# Patient Record
Sex: Male | Born: 1995 | Race: Black or African American | Hispanic: No | Marital: Single | Smoking: Former smoker
Health system: Southern US, Community
[De-identification: ages and names within clinical notes are randomized; demographics above are authoritative.]

## PROBLEM LIST (undated history)

## (undated) DIAGNOSIS — J45909 Unspecified asthma, uncomplicated: Secondary | ICD-10-CM

---

## 2014-02-21 ENCOUNTER — Encounter (HOSPITAL_COMMUNITY): Payer: Self-pay | Admitting: Emergency Medicine

## 2014-02-21 ENCOUNTER — Emergency Department (HOSPITAL_COMMUNITY)
Admission: EM | Admit: 2014-02-21 | Discharge: 2014-02-21 | Disposition: A | Discharge status: Home / Self Care | Payer: BC Managed Care – PPO | Attending: Emergency Medicine | Admitting: Emergency Medicine

## 2014-02-21 DIAGNOSIS — Z792 Long term (current) use of antibiotics: Secondary | ICD-10-CM | POA: Insufficient documentation

## 2014-02-21 DIAGNOSIS — R3911 Hesitancy of micturition: Secondary | ICD-10-CM | POA: Diagnosis not present

## 2014-02-21 DIAGNOSIS — J45909 Unspecified asthma, uncomplicated: Secondary | ICD-10-CM | POA: Diagnosis not present

## 2014-02-21 DIAGNOSIS — R339 Retention of urine, unspecified: Secondary | ICD-10-CM | POA: Diagnosis present

## 2014-02-21 HISTORY — DX: Unspecified asthma, uncomplicated: J45.909

## 2014-02-21 LAB — URINALYSIS, ROUTINE W REFLEX MICROSCOPIC
Bilirubin Urine: NEGATIVE
Glucose, UA: NEGATIVE mg/dL
HGB URINE DIPSTICK: NEGATIVE
Ketones, ur: NEGATIVE mg/dL
Leukocytes, UA: NEGATIVE
Nitrite: NEGATIVE
Protein, ur: NEGATIVE mg/dL
SPECIFIC GRAVITY, URINE: 1.023 (ref 1.005–1.030)
UROBILINOGEN UA: 0.2 mg/dL (ref 0.0–1.0)
pH: 7 (ref 5.0–8.0)

## 2014-02-21 MED ORDER — CEFTRIAXONE SODIUM 250 MG IJ SOLR
250.0000 mg | Freq: Once | INTRAMUSCULAR | Status: AC
  Administered 2014-02-21: 250 mg via INTRAMUSCULAR
  Filled 2014-02-21: qty 250

## 2014-02-21 MED ORDER — AZITHROMYCIN 1 G PO PACK
2.0000 g | PACK | Freq: Once | ORAL | Status: AC
  Administered 2014-02-21: 2 g via ORAL
  Filled 2014-02-21: qty 2

## 2014-02-21 MED ORDER — STERILE WATER FOR INJECTION IJ SOLN
INTRAMUSCULAR | Status: AC
  Administered 2014-02-21: 2 mL
  Filled 2014-02-21: qty 10

## 2014-02-21 NOTE — ED Notes (Signed)
Pt. reports urinary hesitation " hard to start.." onset this evening , denies dysuria , no fever or chills. Denies penile discharge.

## 2014-02-21 NOTE — ED Provider Notes (Signed)
CSN: [id number]     Arrival date & time 02/21/14  0221 History   First MD Initiated Contact with Patient 02/21/14 [phone]     Chief Complaint  Patient presents with  . Urinary Retention     (Consider location/radiation/quality/duration/timing/severity/associated sxs/prior Treatment) HPI Todd Barajas is a 18 y.o. male with past medical history of asthma coming in with urinary retention. Patient states this occurred approximately one half hours prior to arrival. He describes trouble urinating. He states that he struggles to begin urinating. He also states that mid stream the flow will stop and then go again. He denies ever having this in the past. He has had recent sex but he states he is having using a condom every time. He denies any dysuria or hematuria. He's had no changes in his bowel movements. He denies any fevers. He has no scrotal swelling or tenderness. Patient has no further complaints.  10 Systems reviewed and are negative for acute change except as noted in the HPI.   Past Medical History  Diagnosis Date  . Asthma    History reviewed. No pertinent past surgical history. No family history on file. History  Substance Use Topics  . Smoking status: Never Smoker   . Smokeless tobacco: Not on file  . Alcohol Use: No    Review of Systems    Allergies  Review of patient's allergies indicates no known allergies.  Home Medications   Prior to Admission medications   Medication Sig Start Date End Date Taking? Authorizing Provider  amoxicillin (AMOXIL) 875 MG tablet Take 875 mg by mouth once.   Yes Historical Provider, MD   BP 140/87  Pulse 68  Temp(Src) 98.2 F (36.8 C) (Oral)  Resp 20  SpO2 100% Physical Exam  Nursing note and vitals reviewed. Constitutional: He is oriented to person, place, and time. Vital signs are normal. He appears well-developed and well-nourished.  Non-toxic appearance. He does not appear ill. No distress.  HENT:  Head: Normocephalic and  atraumatic.  Nose: Nose normal.  Mouth/Throat: Oropharynx is clear and moist. No oropharyngeal exudate.  Eyes: Conjunctivae and EOM are normal. Pupils are equal, round, and reactive to light. No scleral icterus.  Neck: Normal range of motion. Neck supple. No tracheal deviation, no edema, no erythema and normal range of motion present. No mass and no thyromegaly present.  Cardiovascular: Normal rate, regular rhythm, S1 normal, S2 normal, normal heart sounds, intact distal pulses and normal pulses.  Exam reveals no gallop and no friction rub.   No murmur heard. Pulses:      Radial pulses are 2+ on the right side, and 2+ on the left side.       Dorsalis pedis pulses are 2+ on the right side, and 2+ on the left side.  Pulmonary/Chest: Effort normal and breath sounds normal. No respiratory distress. He has no wheezes. He has no rhonchi. He has no rales.  Abdominal: Soft. Normal appearance and bowel sounds are normal. He exhibits no distension, no ascites and no mass. There is no hepatosplenomegaly. There is no tenderness. There is no rebound, no guarding and no CVA tenderness.  Genitourinary: Penis normal. No penile tenderness.  No discharge seen, no penile tenderness or swelling. No scrotal swelling or tenderness. No epididymal tenderness.  Musculoskeletal: Normal range of motion. He exhibits no edema and no tenderness.  Lymphadenopathy:    He has no cervical adenopathy.  Neurological: He is alert and oriented to person, place, and time. He has normal strength. No  cranial nerve deficit or sensory deficit. He exhibits normal muscle tone. GCS eye subscore is 4. GCS verbal subscore is 5. GCS motor subscore is 6.  Skin: Skin is warm, dry and intact. No petechiae and no rash noted. He is not diaphoretic. No erythema. No pallor.  Psychiatric: He has a normal mood and affect. His behavior is normal. Judgment normal.    ED Course  Procedures (including critical care time) Labs Review Labs Reviewed   URINALYSIS, ROUTINE W REFLEX MICROSCOPIC - Abnormal; Notable for the following:    APPearance CLOUDY (*)    All other components within normal limits  GC/CHLAMYDIA PROBE AMP    Imaging Review No results found.   EKG Interpretation None      MDM   Final diagnoses:  None    Patient presents to the emergency department for urinary hesitation.  I am unsure of the cause and he seems to young for enlarged prostate. Urine does not show an infection. Because he is sexually active will treat for sexually transmitted infections with ceftriaxone and azithromycin. and have him follow-up with a primary care physician. His vital signs remained within normal limits and he is safe for discharge.    Tomasita CrumbleAdeleke Morrison Masser, MD 02/21/14 [phone]

## 2014-02-21 NOTE — ED Notes (Signed)
Pt w/ 106ml of urine per bladder scanner, Dr. Mora Bellmanni, EDP made aware.

## 2014-02-21 NOTE — ED Notes (Signed)
Pt ambulating independently w/ steady gait on d/c in no acute distress, A&Ox4. D/c instructions reviewed w/ pt - pt denies any further questions or concerns at present.  

## 2014-02-21 NOTE — Discharge Instructions (Signed)
Acute Urinary Retention Mr. Jean RosenthalJackson, you were seen for difficulty urinating. The urine does not show infection. You were treated for gonorrhea and chlamydia but the test will not come back for another 2 days. Follow-up with a primary care physician regarding her difficulty urinating. If any of your symptoms worsen come back to the emergency department. Thank you. Acute urinary retention is when you are unable to pee (urinate). Acute urinary retention is common in older men. Prostates can get bigger, which blocks the flow of pee.  HOME CARE  Drink enough fluids to keep your pee clear or pale yellow.  If you are sent home with a tube that drains the bladder (catheter), there will be a drainage bag attached to it. There are two types of bags. One is big that you can wear at night without having to empty it. One is smaller and needs to be emptied more often.  Keep the drainage bag empty.  Keep the drainage bag lower than your catheter.  Only take medicine as told by your doctor. GET HELP IF:  You have a low-grade fever.  You have spasms or you are leaking pee when you have spasms. GET HELP RIGHT AWAY IF:   You have chills or a fever.  Your catheter stops draining pee.  Your catheter falls out.  You have increased bleeding that does not stop after you have rested and increased the amount of fluids you had been drinking. MAKE SURE YOU:   Understand these instructions.  Will watch your condition.  Will get help right away if you are not doing well or get worse. Document Released: 09/28/2007 Document Revised: 01/30/2013 Document Reviewed: 09/20/2012 [facility] Patient Information 2015 [place], [place]. This information is not intended to replace advice given to you by your health care provider. Make sure you discuss any questions you have with your health care provider.  Emergency Department Resource Guide 1) Find a Doctor and Pay Out of Pocket Although you won't have to find out who is  covered by your insurance plan, it is a good idea to ask around and get recommendations. You will then need to call the office and see if the doctor you have chosen will accept you as a new patient and what types of options they offer for patients who are self-pay. Some doctors offer discounts or will set up payment plans for their patients who do not have insurance, but you will need to ask so you aren't surprised when you get to your appointment.  2) Contact Your Local Health Department Not all health departments have doctors that can see patients for sick visits, but many do, so it is worth a call to see if yours does. If you don't know where your local health department is, you can check in your phone book. The CDC also has a tool to help you locate your state's health department, and many state websites also have listings of all of their local health departments.  3) Find a Walk-in Clinic If your illness is not likely to be very severe or complicated, you may want to try a walk in clinic. These are popping up all over the country in pharmacies, drugstores, and shopping centers. They're usually staffed by nurse practitioners or physician assistants that have been trained to treat common illnesses and complaints. They're usually fairly quick and inexpensive. However, if you have serious medical issues or chronic medical problems, these are probably not your best option.  No Primary Care Doctor: - Call  Health Connect at  [phone] - they can help you locate a primary care doctor that  accepts your insurance, provides certain services, etc. - Physician Referral Service- [phone]  Chronic Pain Problems: Organization         Address  Phone   Notes  [facility]  [phone] Patients need to be referred by their primary care doctor.   Medication Assistance: Organization         Address  Phone   Notes  [facility] Medication [facility] [address] [place], [place] [address] [phone] --Must be a resident of [facility] -- Must have NO insurance coverage whatsoever (no Medicaid/ Medicare, etc.) -- The pt. MUST have a primary care doctor that directs their care regularly and follows them in the community   MedAssist  [phone]   [employer]  [phone]    Agencies that provide inexpensive medical care: Organization         Address  Phone   Notes  [place]  [phone]   [facility]    [phone]   [facility] [place], [place] [address] [phone]   [place] [address] [place] [phone]   Planned Parenthood    [phone]   [facility]    [phone]   [place] and [address], [place] Phone:  [phone], Fax:  [phone] Hours of Operation:  9 am - 6 pm, M-F.  Also accepts Medicaid/Medicare and self-pay.  [facility] for [place], [address], [place] Phone: [phone], Fax: [phone]. Hours of Operation:  8:30 am - 5:30 pm, M-F.  Also accepts Medicaid and self-pay.  [facility] [place] [address] [place] Phone: [phone]   [place], [place], [place] [phone], Ext. [phone] Mondays & Thursdays: 7-9 AM.  First 15 patients are seen on a first come, first serve basis.    [place]bert [place] Providers:  Organization         Address  Phone   Notes  [facility] [address], [place] [phone] Also accepts self-pay patients.  [facility] [address], [place]  [phone]   [place], [address], [place] [phone]   [facility] [address], [place] [phone]   Lucianne Lei [address], [place]   [phone] Only accepts [place] Access [place] patients after they have their name applied to their card.   Self-Pay (no insurance) in [facility]:  Organization         Address  Phone   Notes  Sickle Cell Patients, [facility] [place] [phone]   [facility] [place] [phone]   [facility] [place]  [place], [place], [place] [phone]   [facility]/Dr. Osei-Bonsu  [address], [place] or [address], [place] [phone] Phone number for both [place] and [place] locations is the same.  [facility] [address], [place] [phone]   [place]  [address] [place] or [address] [phone]   [facility] [place] [phone], phone; [phone], fax Sees patients 1st and 3rd Saturday of every month.  Must not qualify for public or private insurance (i.e. Medicaid, Medicare, [place] Health Choice, Veterans' Benefits)  Household income should be no more than 200% of the poverty level The clinic cannot treat you if you are pregnant or think you are pregnant  Sexually transmitted diseases are not treated at the clinic.    Dental Care: Organization         Address  Phone  Notes  [facility] [place] [phone] Accepts children up to age 73 who are enrolled in [place] or [place]; pregnant women with a Medicaid card; and children who have applied for Medicaid or [place] Health Choice, but were declined, whose parents can pay a reduced fee at time of service.  [facility]  [address], [place] [phone] Accepts children up to age 59 who are enrolled  in [place] or [place]; pregnant women with a Medicaid card; and children who have applied for Medicaid or [place] Health Choice, but were declined, whose parents can pay a reduced fee at time of service.  [place] Adult Dental Access PROGRAM  [place] [phone] Patients are seen by appointment only. Walk-ins are not accepted. [place] will see patients 26 years of age and older. Monday - Tuesday (8am-5pm) Most Wednesdays (8:30-5pm) $30 per visit, cash only  [facility] Adult Dental Access PROGRAM  [address], [facility] [phone] Patients are seen by appointment only. Walk-ins are not accepted. [place] will see patients 29 years of age and older. One Wednesday Evening (Monthly: Volunteer Based).  $30 per visit, cash only  [place]  [phone] for adults; Children under age 66, call Graduate Pediatric Dentistry at [phone]. Children aged 45-14, please call [phone] to request a pediatric application.  Dental services are provided in all areas of dental care including fillings, crowns and bridges, complete and partial dentures, implants, gum treatment, root canals, and extractions. Preventive care is also provided. Treatment is provided to both adults and children. Patients are selected via a lottery and there is often a waiting list.   [facility] [address] [place]  [phone] [email or web address]   [facility] [address] [place], [place] [phone], Ext. [phone] Second and Fourth Thursday of each month, opens at 6:30 AM; Clinic ends at 9 AM.  Patients are seen on a first-come first-served basis, and a limited number are seen during each clinic.   [facility]  [address] Hillard Danker [place], [place] [phone]   Eligibility Requirements You must have lived in [place], [place], or [place] counties for at least the last three months.   You cannot be  eligible for state or federal sponsored [employer], including [employer], [place], or [employer].   You generally cannot be eligible for healthcare insurance through your employer.    How to apply: Eligibility screenings are held every Tuesday and Wednesday afternoon from 1:00 pm until 4:00 pm. You do not need an appointment for the interview!  [facility] [address] [place], [place]   [place]  Box Department  [place]  [phone]    Behavioral Health Resources in the Community: Intensive Outpatient Programs Organization         Address  Phone  Notes  [place]. [address] [place] [address], [place], [place] [phone]   [facility] Outpatient [address], [place], [place]   ADS: Alcohol & Drug Svcs [address], [place], [place]   [place] [address]  [place], [place] or [phone]   Substance Abuse Resources Organization         Address  Phone  Notes  Alcohol and Drug Services  [phone]   [place]  [phone]   [facility] [place]   Chinita Pester  [phone]   Residential & Outpatient Substance Abuse Program  [phone]   Psychological Services Organization         Address  Phone  Notes  [facility] [place]  [place]  [phone]   [place] [address], [place] or [phone]    Mobile Crisis Teams Organization         Address  Phone  Notes  [facility], Mobile Crisis Care Unit  [phone]   [facility]  [address] [place], [place]   [place] [address], [place] [phone]    Self-Help/Support Groups Organization          Address  Phone             Notes  [place]. of [place] - variety of support groups  [place] Call for more information  Narcotics Anonymous (NA), [facility] [address], [employer] [place]  2 meetings at this location   Special educational needs teacher         Address  Phone  Notes  ASAP Residential Treatment [place],    [place]  [phone]   [facility]  [address], [place] [address], [place], [place]   [place], [place] [phone] Admissions: 8am-3pm M-F  Incentives Substance [place] [address],    [place], [place] [phone]   [facility] [address] [place], [place], [place]   [facility] [address],  [place], [place]   [facility] - Intensive Outpatient [address]een [address], [place], [place]   [facility] ([place] [place].,  [place], [place] [phone] or [phone]   [facility] Services (RTS) [address], [place], [place] Accepts Medicaid  [facility] [place] [address],  [place] [phone] Substance Abuse/Addiction Treatment   [facility] Organization         Address  Phone  Notes  [facility]  [phone]   Domenic Schwab, PhD [address] Arlis Porta [place], [place]   [phone] or [phone]   [place], [place] [phone]   [place] [address], [place], [place] [phone] Insurance/Medicaid/sponsorship through [employer] and Families [address]  [place], [place] [phone] Therapy/tele-psych/case  [facility] [address]   [place], [place] [phone]    Dr. Lolly MustacheArfeen  [phone]   [facility]  [employer]  [facility] 1) [address], [place] 2) [address], [place] 3)  [address], [place] [phone](336) [phone]  [phone]   [facility] Child Abuse Hotline [phone](336) [phone] or [phone](336) [phone] (After Hours)

## 2014-02-22 LAB — GC/CHLAMYDIA PROBE AMP
CT PROBE, AMP APTIMA: POSITIVE — AB
GC PROBE AMP APTIMA: NEGATIVE

## 2014-02-23 ENCOUNTER — Telehealth (HOSPITAL_COMMUNITY): Payer: Self-pay

## 2014-02-23 NOTE — Telephone Encounter (Signed)
left message for pt to return call. positive for chlamydia- treated per protocol.

## 2014-02-24 ENCOUNTER — Telehealth (HOSPITAL_COMMUNITY): Payer: Self-pay

## 2014-02-26 ENCOUNTER — Telehealth: Payer: Self-pay | Admitting: Emergency Medicine

## 2014-02-27 ENCOUNTER — Telehealth (HOSPITAL_COMMUNITY): Payer: Self-pay

## 2015-04-07 ENCOUNTER — Emergency Department (HOSPITAL_COMMUNITY)
Admission: EM | Admit: 2015-04-07 | Discharge: 2015-04-08 | Disposition: A | Discharge status: Home / Self Care | Payer: BLUE CROSS/BLUE SHIELD | Attending: Emergency Medicine | Admitting: Emergency Medicine

## 2015-04-07 ENCOUNTER — Encounter (HOSPITAL_COMMUNITY): Payer: Self-pay | Admitting: *Deleted

## 2015-04-07 DIAGNOSIS — J45909 Unspecified asthma, uncomplicated: Secondary | ICD-10-CM | POA: Insufficient documentation

## 2015-04-07 DIAGNOSIS — M545 Low back pain, unspecified: Secondary | ICD-10-CM

## 2015-04-07 DIAGNOSIS — Z792 Long term (current) use of antibiotics: Secondary | ICD-10-CM | POA: Insufficient documentation

## 2015-04-07 NOTE — ED Provider Notes (Signed)
CSN: [id number]     Arrival date & time 04/07/15  2217 History  By signing my name below, I, Octavia Heir, attest that this documentation has been prepared under the direction and in the presence of [employer], PA-C. Electronically Signed: Octavia Heir, ED Scribe. 04/07/2015. 11:35 PM.    Chief Complaint  Patient presents with  . Sore    The history is provided by the patient. No language interpreter was used.   HPI Comments: Todd Barajas is a 19 y.o. male who has hx of asthma presents to the Emergency Department complaining of constant, gradual worsening unknown movement on his right flank onset one week ago.  He rates his current pain a 4/10 and notes the pain in tolerable. Pt states he feels as if he feels like there is something moving on his back. He denies nausea, vomiting, diarrhea, abdominal pain or fever. He denies any bowel or bladder incontinence or retention, history of cancer, IV drug use, or recent prednisone use.  Past Medical History  Diagnosis Date  . Asthma    History reviewed. No pertinent past surgical history. No family history on file. Social History  Substance Use Topics  . Smoking status: Never Smoker   . Smokeless tobacco: None  . Alcohol Use: No    Review of Systems  Constitutional: Negative for fever.  Gastrointestinal: Negative for abdominal pain.  All other systems reviewed and are negative.     Allergies  Review of patient's allergies indicates no known allergies.  Home Medications   Prior to Admission medications   Medication Sig Start Date End Date Taking? Authorizing Provider  amoxicillin (AMOXIL) 875 MG tablet Take 875 mg by mouth once.    Historical Provider, MD   Triage vitals: BP 139/82 mmHg  Pulse 62  Temp(Src) 98.2 F (36.8 C) (Oral)  Resp 14  Ht  (1.778 m)  Wt 220 lb (99.791 kg)  BMI 31.57 kg/m2  SpO2 99% Physical Exam  Constitutional: He is oriented to person, place, and time. He appears well-developed and  well-nourished. No distress.  HENT:  Head: Normocephalic and atraumatic.  Eyes: Conjunctivae and EOM are normal.  Neck: Normal range of motion. Neck supple.  Cardiovascular: Normal rate, regular rhythm and normal heart sounds.   Pulmonary/Chest: Effort normal and breath sounds normal. No respiratory distress. He has no wheezes. He has no rales.  Lungs clear to auscultation bilaterally. Heart: Regular rate and rhythm.  Abdominal:  Abdomen is soft and nontender. No guarding or rebound. No distention.  Musculoskeletal: Normal range of motion. He exhibits no edema.  No CVA tenderness  Neurological: He is alert and oriented to person, place, and time.  Skin: Skin is warm and dry.  No rash on the flank or abdomen.  Psychiatric: He has a normal mood and affect. His behavior is normal.  Nursing note and vitals reviewed.   ED Course  Procedures  DIAGNOSTIC STUDIES: Oxygen Saturation is 99% on RA, normal by my interpretation.  COORDINATION OF CARE:  11:30 PM Discussed treatment plan which includes lab work with pt at bedside and pt agreed to plan.  Labs Review Labs Reviewed  URINALYSIS, ROUTINE W REFLEX MICROSCOPIC (NOT AT [facility]) - Abnormal; Notable for the following:    APPearance CLOUDY (*)    All other components within normal limits  I-STAT CHEM 8, ED    Imaging Review No results found. I have personally reviewed and evaluated these lab results as part of my medical decision-making.   EKG  Interpretation None      MDM  Pt presents to the ED with an unknown movement in his right flank area times one week. He is well appearing and ambulating in the ED. He does not appear to be in pain now. Basic chem 8 and urinalysis was obtained and are unremarkable. I do not believe this is kidney related or a urinary tract infection. This may be a muscle pull although the patient denies any heavy lifting or injury to the area. I explained that he can take ibuprofen as needed for pain. I  discussed follow-up with the patient as well as return precautions and he verbally agrees with the plan. Final diagnoses:  Right-sided low back pain without sciatica   Filed Vitals:   04/07/15 2248 04/08/15 0017  BP: 139/82 137/83  Pulse: 62 72  Temp: 98.2 F (36.8 C)   Resp: 14 14   I personally performed the services described in this documentation, which was scribed in my presence. The recorded information has been reviewed and is accurate.   Catha GosselinHanna Patel-Mills, PA-C 04/10/15 1524  Raeford RazorStephen Kohut, MD 04/14/15 1245

## 2015-04-07 NOTE — ED Notes (Signed)
Pt has focal soreness on right back (Where ribs end).  Pt states that its a "lump", no injury with this.  I can not feel or see anything in the area that he is pointing out as sore.  [facility] and told that he had "unknown bacteria in body" and was placed on antibiotics.  Pt denies increased pain with movement.

## 2015-04-08 LAB — URINALYSIS, ROUTINE W REFLEX MICROSCOPIC
BILIRUBIN URINE: NEGATIVE
GLUCOSE, UA: NEGATIVE mg/dL
HGB URINE DIPSTICK: NEGATIVE
Ketones, ur: NEGATIVE mg/dL
Leukocytes, UA: NEGATIVE
Nitrite: NEGATIVE
PROTEIN: NEGATIVE mg/dL
Specific Gravity, Urine: 1.026 (ref 1.005–1.030)
pH: 5.5 (ref 5.0–8.0)

## 2015-04-08 LAB — I-STAT CHEM 8, ED
BUN: 14 mg/dL (ref 6–20)
CREATININE: 1 mg/dL (ref 0.61–1.24)
Calcium, Ion: 1.22 mmol/L (ref 1.12–1.23)
Chloride: 102 mmol/L (ref 101–111)
Glucose, Bld: 80 mg/dL (ref 65–99)
HCT: 48 % (ref 39.0–52.0)
Hemoglobin: 16.3 g/dL (ref 13.0–17.0)
POTASSIUM: 4.2 mmol/L (ref 3.5–5.1)
Sodium: 141 mmol/L (ref 135–145)
TCO2: 27 mmol/L (ref 0–100)

## 2015-04-08 NOTE — Discharge Instructions (Signed)
Back Pain, Adult °Back pain is very common in adults. The cause of back pain is rarely dangerous and the pain often gets better over time. The cause of your back pain may not be known. Some common causes of back pain include: °· Strain of the muscles or ligaments supporting the spine. °· Wear and tear (degeneration) of the spinal disks. °· Arthritis. °· Direct injury to the back. °For many people, back pain may return. Since back pain is rarely dangerous, most people can learn to manage this condition on their own. °HOME CARE INSTRUCTIONS °Watch your back pain for any changes. The following actions may help to lessen any discomfort you are feeling: °· Remain active. It is stressful on your back to sit or stand in one place for long periods of time. Do not sit, drive, or stand in one place for more than 30 minutes at a time. Take short walks on even surfaces as soon as you are able. Try to increase the length of time you walk each day. °· Exercise regularly as directed by your health care provider. Exercise helps your back heal faster. It also helps avoid future injury by keeping your muscles strong and flexible. °· Do not stay in bed. Resting more than 1-2 days can delay your recovery. °· Pay attention to your body when you bend and lift. The most comfortable positions are those that put less stress on your recovering back. Always use proper lifting techniques, including: °¨ Bending your knees. °¨ Keeping the load close to your body. °¨ Avoiding twisting. °· Find a comfortable position to sleep. Use a firm mattress and lie on your side with your knees slightly bent. If you lie on your back, put a pillow under your knees. °· Avoid feeling anxious or stressed. Stress increases muscle tension and can worsen back pain. It is important to recognize when you are anxious or stressed and learn ways to manage it, such as with exercise. °· Take medicines only as directed by your health care provider. Over-the-counter  medicines to reduce pain and inflammation are often the most helpful. Your health care provider may prescribe muscle relaxant drugs. These medicines help dull your pain so you can more quickly return to your normal activities and healthy exercise. °· Apply ice to the injured area: °¨ Put ice in a plastic bag. °¨ Place a towel between your skin and the bag. °¨ Leave the ice on for 20 minutes, 2-3 times a day for the first 2-3 days. After that, ice and heat may be alternated to reduce pain and spasms. °· Maintain a healthy weight. Excess weight puts extra stress on your back and makes it difficult to maintain good posture. °SEEK MEDICAL CARE IF: °· You have pain that is not relieved with rest or medicine. °· You have increasing pain going down into the legs or buttocks. °· You have pain that does not improve in one week. °· You have night pain. °· You lose weight. °· You have a fever or chills. °SEEK IMMEDIATE MEDICAL CARE IF:  °· You develop new bowel or bladder control problems. °· You have unusual weakness or numbness in your arms or legs. °· You develop nausea or vomiting. °· You develop abdominal pain. °· You feel faint. °  °This information is not intended to replace advice given to you by your health care provider. Make sure you discuss any questions you have with your health care provider. °  °Document Released: 04/11/2005 Document Revised: 05/02/2014 Document Reviewed: 08/13/2013 °Elsevier Interactive Patient Education ©2016 Elsevier   Inc. ° °Emergency Department Resource Guide °1) Find a Doctor and Pay Out of Pocket °Although you won't have to find out who is covered by your insurance plan, it is a good idea to ask around and get recommendations. You will then need to call the office and see if the doctor you have chosen will accept you as a new patient and what types of options they offer for patients who are self-pay. Some doctors offer discounts or will set up payment plans for their patients who do not  have insurance, but you will need to ask so you aren't surprised when you get to your appointment. ° °2) Contact Your Local Health Department °Not all health departments have doctors that can see patients for sick visits, but many do, so it is worth a call to see if yours does. If you don't know where your local health department is, you can check in your phone book. The CDC also has a tool to help you locate your state's health department, and many state websites also have listings of all of their local health departments. ° °3) Find a Walk-in Clinic °If your illness is not likely to be very severe or complicated, you may want to try a walk in clinic. These are popping up all over the country in pharmacies, drugstores, and shopping centers. They're usually staffed by nurse practitioners or physician assistants that have been trained to treat common illnesses and complaints. They're usually fairly quick and inexpensive. However, if you have serious medical issues or chronic medical problems, these are probably not your best option. ° °No Primary Care Doctor: °- Call [facility] at  832-8000 - they can help you locate a primary care doctor that  accepts your insurance, provides certain services, etc. °- Physician Referral Service- 1-800-533-3463 ° °Chronic Pain Problems: °Organization         Address  Phone   Notes  °[facility]  (336) 297-2271 Patients need to be referred by their primary care doctor.  ° °Medication Assistance: °Organization         Address  Phone   Notes  °[place] [facility] [address] °[place], [place] [address] (336) 641-8030 --Must be a resident of [place] °-- Must have NO insurance coverage whatsoever (no Medicaid/ Medicare, etc.) °-- The pt. MUST have a primary care doctor that directs their care regularly and follows them in the community °  °[facility]  (866) 331-1348   °[employer]  (888) 892-1162   ° °Agencies that  provide inexpensive medical care: °Organization         Address  Phone   Notes  °[facility]  (336) 832-8035   ° Internal Medicine    (336) 832-7272   °[facility] [address] °[place], [place] [address] (336) 832-4777   °[facility] [address], °[place] (336) 271-4999   °[facility]    (336) 373-0678   °[facility]    (336) 272-1050   °[facility] ° [address], [place] Phone:  (336) 832-4444, Fax:  (336) 832-4440 Hours of Operation:  9 am - 6 pm, M-F.  Also accepts Medicaid/Medicare and self-pay.  ° [facility] ° [address], [place] Phone: (336) 832-3150, Fax: (336) 832-3151. Hours of Operation:  8:30 am - 5:30 pm, M-F.  Also accepts Medicaid and self-pay.  °[facility]   [place] [address], [place] Phone: (336) 878-6027   °[facility] [address], [place], [place] (336)723-1848, Ext. 123 Mondays & Thursdays: 7-9 AM.  First 15 patients are seen on a first come, first serve basis. °  ° °Medicaid-accepting [place] Providers: ° °Organization         Address  Phone   Notes  °[facility] [address], [place] (336) 641-2100 Also accepts self-pay patients.  °[facility] [address], [place] ° (336) 856-9996   °[facility] [address], [place] (336) 288-8857   °[facility] [address] [place] [address], [place] (336) 299-7000   °Veita Bland [address], [place]  ° (336) 373-1557 Only accepts [place] Access Medicaid patients after they have their name applied to their card.  ° °Self-Pay (no insurance) in [place]: ° °Organization         Address  Phone   Notes  °Sickle Cell Patients, [facility] [address], [place] (336) 832-1970   °[facility] [address], [place] (336) 832-4400   ° [facility] ° [address], [place] (336) 992-4800   °[facility]/Dr. Osei-Bonsu ° [address] [place] [address], [place] or [address], [place] (336) 841-8500 Phone number for both [place] and [place] locations is the same.  °[facility] [address], [place] (336) 299-0000   °[facility] [address] [place] [address], [place] or [address] (336) 852-7530 °(336) 878-2260   °[facility] [address], [place] (336) 350-1642, phone; (336) 294-5005, fax Sees patients 1st and 3rd Saturday of every month.  Must not qualify for public or private insurance (i.e. Medicaid, Medicare, [place] Health Choice, Veterans' Benefits) • Household income should be no more than 200% of the poverty level •The clinic cannot treat you if you are pregnant or think you are pregnant • Sexually transmitted diseases are not treated at the clinic.  ° ° °Dental Care: °Organization         Address  Phone  Notes  °[facility] [facility] [address], [place] (336) 641-6152 Accepts children up to age 21 who are enrolled in Medicaid or [place] Health Choice; pregnant women with a Medicaid card; and children who have applied for Medicaid or [place] Health Choice, but were declined, whose parents can pay a reduced fee at time of service.  °[facility] [place]  [address], [place] (336) 641-7733 Accepts children up to age 21 who are enrolled in Medicaid or [place] Health Choice; pregnant women with a Medicaid card; and children who have applied for Medicaid or [place] Health Choice, but were declined, whose parents can pay a reduced fee at time of service.  °[facility] ° [address], [place] (336) 641-4533 Patients are seen by appointment only. Walk-ins  are not accepted. [facility] will see patients 18 years of age and older. °Monday - Tuesday (8am-5pm) °Most Wednesdays (8:30-5pm) °$30 per visit, cash only  °[facility] ° [address], [place] (336) 641-4533 Patients are seen by appointment only. Walk-ins are not accepted. [facility] will see patients 18 years of age   and older. °One Wednesday Evening (Monthly: Volunteer Based).  $30 per visit, cash only  °[facility]  (919) 537-3737 for adults; Children under age 4, call Graduate Pediatric Dentistry at (919) [phone]. Children aged 4-14, please call (919) [phone] to request a pediatric application. ° Dental services are provided in all areas of dental care including fillings, crowns and bridges, complete and partial dentures, implants, gum treatment, root canals, and extractions. Preventive care is also provided. Treatment is provided to both adults and children. °Patients are selected via a lottery and there is often a waiting list. °  °[facility] [address], °[place] ° (336) 763-8833 [email or web address] °  °[facility] [address], [place], [place] (336)723-1848, Ext. 123 Second and Fourth Thursday of each month, opens at 6:30 AM; Clinic ends at 9 AM.  Patients are seen on a first-come first-served basis, and a limited number are seen during each clinic.  ° °[facility] ° [address], [place], [place] (336) 723-7904   Eligibility Requirements °You must have lived in [place], [place], or [place] counties for at least the last three months. °  You cannot be eligible for state or federal sponsored healthcare insurance, including Veterans Administration, Medicaid, or Medicare. °  You generally cannot be eligible for healthcare insurance through your employer.  °  How to apply: °Eligibility screenings are held every Tuesday and Wednesday afternoon from 1:00 pm until 4:00 pm. You do not need an appointment  for the interview!  °[facility] [address], [place], [place] 336-631-2330   °[facility]  336-342-8273   °[facility]  336-703-3100   °[facility]  336-570-6415   ° °Behavioral Health Resources in the Community: °Intensive Outpatient Programs °Organization         Address  Phone  Notes  °[facility] [address], [place], [place] 336-878-6098   °[facility] [address], [place], [place] 336-832-9800   °[facility] [address], [place], [place] ° 336-882-2125   °[facility] [address],  °[place], [place] 1-800-853-5163 or 336-641-4981   °Substance Abuse Resources °Organization         Address  Phone  Notes  °[facility]  336-882-2125   °[facility]  336-784-9470   °[facility]  336-285-9073   °[facility]  336-845-3988   °Residential & Outpatient Substance Abuse Program  1-800-659-3381   °Psychological Services °Organization         Address  Phone  Notes  °[facility]  336- 832-9600   °[facility]  336- 378-7881   °[facility] [address], [place] 1-800-853-5163 or 336-641-4981   ° °Mobile Crisis Teams °Organization         Address  Phone  Notes  °[facility], Mobile Crisis Care Unit  1-877-626-1772   °Assertive °[facility] ° [address] [place], [place] 336-834-9664   °Sharon DeEsch [address] °[place] 336-554-5454   ° °Self-Help/Support Groups °Organization         Address  Phone             Notes  °[facility]  - variety of support groups  336- 373-1402 Call for more information  °Narcotics Anonymous (NA), [facility] [address], °[place]   2 meetings at   this location  ° °Residential Treatment Programs °Organization         Address  Phone  Notes  °[facility] [address],    °[place]  1-866-801-8205   °[facility] ° [address], [place], [place] 704-293-8524   °[facility] [address], [place] 336-845-3988 Admissions: 8am-3pm M-F  °[facility] [address]    °[place], [place] 336-841-1104   °[facility] [address], [place], [place] 336-379-7146   °[facility] [address]  °[place], [place] 336-285-9073   °[facility] - Intensive Outpatient [address], [place], [place] 336-852-3033   °[facility] [address]  °[place], [place] 1-877-615-2722 or 336-784-9470   °[facility] [address] [place], [place] 336-227-7417 Accepts Medicaid  °[facility] [address]  ° [place] 1-800-659-3381 Substance Abuse/Addiction Treatment  ° °[place] Behavioral Health Resources °Organization         Address  Phone  Notes  °[facility]  (888) 581-9988   °Julie Brannon, PhD [address] [place], [place]   (336) 349-5553 or (336) 951-0000   °[facility]   [address] °[place], [place] (336) 349-4454   °[facility] [address], [place], [place] (336) 342-8316 Insurance/Medicaid/sponsorship through [facility]  °[facility] [address]                                    [place], [place] (336) 342-8316 Therapy/tele-psych/case  °[facility] [address]  ° [place], [place] (336) 349-2233    °Dr. Arfeen  (336) 349-4544   °[facility] [place]  [employer] [place] [facility] 1) [address], [place] °2) [address], [place] °3)  [address], [place] (336) 349-3220 °(336) 342-7768 ° °(336) 342-8140   °[place] [facility] (336) 342-1394 or (336) 342-3537 (After Hours)    ° ° ° °

## 2015-05-19 ENCOUNTER — Encounter (HOSPITAL_COMMUNITY): Payer: Self-pay

## 2015-05-19 ENCOUNTER — Emergency Department (HOSPITAL_COMMUNITY): Payer: BLUE CROSS/BLUE SHIELD

## 2015-05-19 ENCOUNTER — Emergency Department (HOSPITAL_COMMUNITY)
Admission: EM | Admit: 2015-05-19 | Discharge: 2015-05-19 | Disposition: A | Discharge status: Home / Self Care | Payer: BLUE CROSS/BLUE SHIELD | Attending: Emergency Medicine | Admitting: Emergency Medicine

## 2015-05-19 DIAGNOSIS — M546 Pain in thoracic spine: Secondary | ICD-10-CM | POA: Insufficient documentation

## 2015-05-19 DIAGNOSIS — J45909 Unspecified asthma, uncomplicated: Secondary | ICD-10-CM | POA: Insufficient documentation

## 2015-05-19 LAB — URINALYSIS, ROUTINE W REFLEX MICROSCOPIC
Bilirubin Urine: NEGATIVE
Glucose, UA: NEGATIVE mg/dL
Hgb urine dipstick: NEGATIVE
Ketones, ur: NEGATIVE mg/dL
LEUKOCYTES UA: NEGATIVE
NITRITE: NEGATIVE
PH: 6 (ref 5.0–8.0)
Protein, ur: NEGATIVE mg/dL
SPECIFIC GRAVITY, URINE: 1.034 — AB (ref 1.005–1.030)

## 2015-05-19 LAB — I-STAT CHEM 8, ED
BUN: 17 mg/dL (ref 6–20)
CHLORIDE: 104 mmol/L (ref 101–111)
Calcium, Ion: 1.2 mmol/L (ref 1.12–1.23)
Creatinine, Ser: 0.8 mg/dL (ref 0.61–1.24)
GLUCOSE: 89 mg/dL (ref 65–99)
HEMATOCRIT: 48 % (ref 39.0–52.0)
Hemoglobin: 16.3 g/dL (ref 13.0–17.0)
POTASSIUM: 3.8 mmol/L (ref 3.5–5.1)
Sodium: 141 mmol/L (ref 135–145)
TCO2: 22 mmol/L (ref 0–100)

## 2015-05-19 MED ORDER — IBUPROFEN 400 MG PO TABS
800.0000 mg | ORAL_TABLET | Freq: Once | ORAL | Status: AC
  Administered 2015-05-19: 800 mg via ORAL
  Filled 2015-05-19: qty 2

## 2015-05-19 MED ORDER — IBUPROFEN 800 MG PO TABS: 800.0000 mg | ORAL_TABLET | Freq: Three times a day (TID) | ORAL | Status: DC

## 2015-05-19 NOTE — ED Notes (Signed)
Pt reports seen in ED one month ago for right mid lateral back pain.  Pain is worsening, intermittant x 4-5 times day.  No other s/s noted.  NAD at triage.

## 2015-05-19 NOTE — ED Provider Notes (Signed)
CSN: [id number]     Arrival date & time 05/19/15  1508 History   By signing my name below, I, Todd Barajas, attest that this documentation has been prepared under the direction and in the presence of Cheri Fowler, PA-C Electronically Signed: Jarvis Barajas, ED Scribe. 05/19/2015. 6:44 PM.    Chief Complaint  Patient presents with  . Back Pain   The history is provided by the patient. No language interpreter was used.   HPI Comments: Todd Barajas is a 20 y.o. male who presents to the Emergency Department complaining of intermittent, sharp, right, mid lateral back pain for 1 month. Pt endorses that it feels like there is a "lump" to the area of pain. Pt states he has "random" episodes of sharp pain 4-5x per day. He reports the pain is not related to movement. Pt has not taken any medications for the pain. Pt notes he was seen in ED 1 month ago for the same; per chart review pt had a chem 8 and UA done at the time which were negative.  He denies any injury or heavy lifting. He denies any fevers, chills, n/v/d, abdominal pain, dysuria, hematuria, bowel/bladder incontinence, saddle anesthesia, or other associated symptoms at this time.  No IVDU or hx of cancer.  Past Medical History  Diagnosis Date  . Asthma    History reviewed. No pertinent past surgical history. History reviewed. No pertinent family history. Social History  Substance Use Topics  . Smoking status: Never Smoker   . Smokeless tobacco: None  . Alcohol Use: No    Review of Systems A complete 10 system review of systems was obtained and all systems are negative except as noted in the HPI and PMH.     Allergies  Review of patient's allergies indicates no known allergies.  Home Medications   Prior to Admission medications   Medication Sig Start Date End Date Taking? Authorizing Provider  amoxicillin (AMOXIL) 875 MG tablet Take 875 mg by mouth once.    Historical Provider, MD   BP 134/82 mmHg  Pulse 65  Temp(Src)  97.6 F (36.4 C) (Oral)  Resp 18  Ht  (1.803 m)  Wt 218 lb 14.4 oz (99.292 kg)  BMI 30.54 kg/m2  SpO2 98% Physical Exam  Constitutional: He is oriented to person, place, and time. He appears well-developed and well-nourished.  Non-toxic appearance. He does not have a sickly appearance. He does not appear ill.  HENT:  Head: Normocephalic and atraumatic.  Mouth/Throat: Oropharynx is clear and moist.  Eyes: Conjunctivae are normal. Pupils are equal, round, and reactive to light.  Neck: Normal range of motion. Neck supple.  Cardiovascular: Normal rate, regular rhythm and normal heart sounds.   No murmur heard. Pulmonary/Chest: Effort normal and breath sounds normal. No accessory muscle usage or stridor. No respiratory distress. He has no wheezes. He has no rhonchi. He has no rales.    Abdominal: Soft. Bowel sounds are normal. He exhibits no distension. There is no tenderness. There is no rigidity, no rebound, no guarding and no CVA tenderness.  Musculoskeletal: Normal range of motion. He exhibits no tenderness.  Lymphadenopathy:    He has no cervical adenopathy.  Neurological: He is alert and oriented to person, place, and time.  Speech clear without dysarthria.  Skin: Skin is warm and dry. No rash noted. No erythema.  Psychiatric: He has a normal mood and affect. His behavior is normal.    ED Course  Procedures (including critical care time)  DIAGNOSTIC STUDIES: Oxygen Saturation is 98% on RA, normal by my interpretation.    COORDINATION OF CARE: 5:35 PM- Will order CXR, chem 8, UA, and Ibuprofen . Pt advised of plan for treatment and pt agrees.   Labs Review Labs Reviewed  URINALYSIS, ROUTINE W REFLEX MICROSCOPIC (NOT AT [facility]) - Abnormal; Notable for the following:    Specific Gravity, Urine 1.034 (*)    All other components within normal limits  I-STAT CHEM 8, ED    Imaging Review Dg Chest 2 View  05/19/2015  CLINICAL DATA:  Posterior RIGHT lower rib pain at  night and tenth ribs and RIGHT flank for 1 month, no known trauma, wears a belt to march with a drum questioned cause EXAM: CHEST  2 VIEW COMPARISON:  None FINDINGS: Normal heart size, mediastinal contours, and pulmonary vascularity. Lungs clear. No pleural effusion or pneumothorax. Bones unremarkable. Specifically, visualized lower RIGHT ribs are normal in appearance. IMPRESSION: Normal exam. Electronically Signed   By: Ulyses Southward M.D.   On: 05/19/2015 17:53   I have personally reviewed and evaluated these images and lab results as part of my medical decision-making.   EKG Interpretation None      MDM   Final diagnoses:  Right-sided thoracic back pain    Patient presents with intermittent right mid lateral back pain.  VSS, NAD.  No meds PTA.  Patient seen 1 month ago in ED for the same instructed to take ibuprofen and possible msk etiology.  No systemic symptoms or urinary symptoms.  No injury/trauma.  On exam, right thoracic back non-TTP.  No masses, deformities, or crepitus.  I am able to palpate patient's rib and he states that is the "lump".  CXR unremarkable.  i-stat chem 8 and UA obtained to r/o kidney or infectious etiology.  I suspect musculoskeletal etiology and plan to d/c home with ibuprofen.  Discussed return precautions.  Patient agrees and acknowledges the above plan for discharge.  I personally performed the services described in this documentation, which was scribed in my presence. The recorded information has been reviewed and is accurate.     Cheri Fowler, PA-C 05/19/15 1846  Vanetta Mulders, MD 05/23/15 [phone]

## 2015-05-19 NOTE — Discharge Instructions (Signed)
Back Pain, Adult °Back pain is very common in adults. The cause of back pain is rarely dangerous and the pain often gets better over time. The cause of your back pain may not be known. Some common causes of back pain include: °· Strain of the muscles or ligaments supporting the spine. °· Wear and tear (degeneration) of the spinal disks. °· Arthritis. °· Direct injury to the back. °For many people, back pain may return. Since back pain is rarely dangerous, most people can learn to manage this condition on their own. °HOME CARE INSTRUCTIONS °Watch your back pain for any changes. The following actions may help to lessen any discomfort you are feeling: °· Remain active. It is stressful on your back to sit or stand in one place for long periods of time. Do not sit, drive, or stand in one place for more than 30 minutes at a time. Take short walks on even surfaces as soon as you are able. Try to increase the length of time you walk each day. °· Exercise regularly as directed by your health care provider. Exercise helps your back heal faster. It also helps avoid future injury by keeping your muscles strong and flexible. °· Do not stay in bed. Resting more than 1-2 days can delay your recovery. °· Pay attention to your body when you bend and lift. The most comfortable positions are those that put less stress on your recovering back. Always use proper lifting techniques, including: °¨ Bending your knees. °¨ Keeping the load close to your body. °¨ Avoiding twisting. °· Find a comfortable position to sleep. Use a firm mattress and lie on your side with your knees slightly bent. If you lie on your back, put a pillow under your knees. °· Avoid feeling anxious or stressed. Stress increases muscle tension and can worsen back pain. It is important to recognize when you are anxious or stressed and learn ways to manage it, such as with exercise. °· Take medicines only as directed by your health care provider. Over-the-counter  medicines to reduce pain and inflammation are often the most helpful. Your health care provider may prescribe muscle relaxant drugs. These medicines help dull your pain so you can more quickly return to your normal activities and healthy exercise. °· Apply ice to the injured area: °¨ Put ice in a plastic bag. °¨ Place a towel between your skin and the bag. °¨ Leave the ice on for 20 minutes, 2-3 times a day for the first 2-3 days. After that, ice and heat may be alternated to reduce pain and spasms. °· Maintain a healthy weight. Excess weight puts extra stress on your back and makes it difficult to maintain good posture. °SEEK MEDICAL CARE IF: °· You have pain that is not relieved with rest or medicine. °· You have increasing pain going down into the legs or buttocks. °· You have pain that does not improve in one week. °· You have night pain. °· You lose weight. °· You have a fever or chills. °SEEK IMMEDIATE MEDICAL CARE IF:  °· You develop new bowel or bladder control problems. °· You have unusual weakness or numbness in your arms or legs. °· You develop nausea or vomiting. °· You develop abdominal pain. °· You feel faint. °  °This information is not intended to replace advice given to you by your health care provider. Make sure you discuss any questions you have with your health care provider. °  °Document Released: 04/11/2005 Document Revised: 05/02/2014 Document Reviewed: 08/13/2013 °Elsevier Interactive Patient Education ©2016 Elsevier   Inc. ° °Emergency Department Resource Guide °1) Find a Doctor and Pay Out of Pocket °Although you won't have to find out who is covered by your insurance plan, it is a good idea to ask around and get recommendations. You will then need to call the office and see if the doctor you have chosen will accept you as a new patient and what types of options they offer for patients who are self-pay. Some doctors offer discounts or will set up payment plans for their patients who do not  have insurance, but you will need to ask so you aren't surprised when you get to your appointment. ° °2) Contact Your Local Health Department °Not all health departments have doctors that can see patients for sick visits, but many do, so it is worth a call to see if yours does. If you don't know where your local health department is, you can check in your phone book. The CDC also has a tool to help you locate your state's health department, and many state websites also have listings of all of their local health departments. ° °3) Find a Walk-in Clinic °If your illness is not likely to be very severe or complicated, you may want to try a walk in clinic. These are popping up all over the country in pharmacies, drugstores, and shopping centers. They're usually staffed by nurse practitioners or physician assistants that have been trained to treat common illnesses and complaints. They're usually fairly quick and inexpensive. However, if you have serious medical issues or chronic medical problems, these are probably not your best option. ° °No Primary Care Doctor: °- Call [facility] at  832-8000 - they can help you locate a primary care doctor that  accepts your insurance, provides certain services, etc. °- Physician Referral Service- 1-800-533-3463 ° °Chronic Pain Problems: °Organization         Address  Phone   Notes  °[facility]  (336) 297-2271 Patients need to be referred by their primary care doctor.  ° °Medication Assistance: °Organization         Address  Phone   Notes  °[place] [facility] [address] °[place], [place] [address] (336) 641-8030 --Must be a resident of [place] °-- Must have NO insurance coverage whatsoever (no Medicaid/ Medicare, etc.) °-- The pt. MUST have a primary care doctor that directs their care regularly and follows them in the community °  °[facility]  (866) 331-1348   °[employer]  (888) 892-1162   ° °Agencies that  provide inexpensive medical care: °Organization         Address  Phone   Notes  °[facility]  (336) 832-8035   °[facility]    (336) 832-7272   °[facility] [address] °[place], [place] [address] (336) 832-4777   °[facility] [address], °[place] (336) 271-4999   °[facility]    (336) 373-0678   °[facility]    (336) 272-1050   °[facility] ° [address], [place] Phone:  (336) 832-4444, Fax:  (336) 832-4440 Hours of Operation:  9 am - 6 pm, M-F.  Also accepts Medicaid/Medicare and self-pay.  °[facility] ° [address], [place] Phone: (336) 832-3150, Fax: (336) 832-3151. Hours of Operation:  8:30 am - 5:30 pm, M-F.  Also accepts Medicaid and self-pay.  °[facility]   [place] [address], [place] Phone: (336) 878-6027   °[facility] [address], [place], [place] (336)723-1848, Ext. 123 Mondays & Thursdays: 7-9 AM.  First 15 patients are seen on a first come, first serve basis. °  ° °Medicaid-accepting [place] Providers: ° °Organization         Address  Phone   Notes  °[facility] [address], [place] (336) 641-2100 Also accepts self-pay patients.  °[facility] [address], [place] ° (336) 856-9996   °[facility] [address], [place] (336) 288-8857   °[facility] [address] [place] [address], [place] (336) 299-7000   °Veita Bland [address], [place]  ° (336) 373-1557 Only accepts [place] Access Medicaid patients after they have their name applied to their card.  ° °Self-Pay (no insurance) in [place]: ° °Organization         Address  Phone   Notes  °Sickle Cell Patients, [facility] [address], [place] (336) 832-1970   °[facility] [address], [place] (336) 832-4400   °[facility] ° [address], [place] (336) 992-4800   °[facility]/Dr. Osei-Bonsu ° [address] [place] [address], [place] or [address], [place] (336) 841-8500 Phone number for both [place] and [place] locations is the same.  °[facility] [address], [place] (336) 299-0000   °[facility] [address] [place] [address], [place] or [address] (336) 852-7530 °(336) 878-2260   °[facility] [address], [place] (336) 350-1642, phone; (336) 294-5005, fax Sees patients 1st and 3rd Saturday of every month.  Must not qualify for public or private insurance (i.e. Medicaid, Medicare, [place] Health Choice, Veterans' Benefits) • Household income should be no more than 200% of the poverty level •The clinic cannot treat you if you are pregnant or think you are pregnant • Sexually transmitted diseases are not treated at the clinic.  ° ° °Dental Care: °Organization         Address  Phone  Notes  °[facility] [address], [place] (336) 641-6152 Accepts children up to age 21 who are enrolled in Medicaid or [place] Health Choice; pregnant women with a Medicaid card; and children who have applied for Medicaid or [place] Health Choice, but were declined, whose parents can pay a reduced fee at time of service.  °[facility] [place]  [address], [place] (336) 641-7733 Accepts children up to age 21 who are enrolled in Medicaid or [place] Health Choice; pregnant women with a Medicaid card; and children who have applied for Medicaid or [place] Health Choice, but were declined, whose parents can pay a reduced fee at time of service.  °[facility] ° [address], [place] (336) 641-4533 Patients are seen by appointment only. Walk-ins  are not accepted. [facility] will see patients 18 years of age and older. °Monday - Tuesday (8am-5pm) °Most Wednesdays (8:30-5pm) °$30 per visit, cash only  °[facility] ° [address], [place] (336) 641-4533 Patients are seen by appointment only. Walk-ins are not accepted. [facility] will see patients 18 years of age   and older. °One Wednesday Evening (Monthly: Volunteer Based).  $30 per visit, cash only  °[facility]  (919) 537-3737 for adults; Children under age 4, call Graduate Pediatric Dentistry at (919) [phone]. Children aged 4-14, please call (919) 537-3737 to request a pediatric application. ° Dental services are provided in all areas of dental care including fillings, crowns and bridges, complete and partial dentures, implants, gum treatment, root canals, and extractions. Preventive care is also provided. Treatment is provided to both adults and children. °Patients are selected via a lottery and there is often a waiting list. °  °[facility] [address], °[place] ° (336) 763-8833 [email or web address] °  °[facility] [address], [place], [place] (336)723-1848, Ext. 123 Second and Fourth Thursday of each month, opens at 6:30 AM; Clinic ends at 9 AM.  Patients are seen on a first-come first-served basis, and a limited number are seen during each clinic.  ° °[facility] ° [address], [place], [place] (336) 723-7904   Eligibility Requirements °You must have lived in [place], [place], or [place] counties for at least the last three months. °  You cannot be eligible for state or federal sponsored healthcare insurance, including Veterans Administration, Medicaid, or Medicare. °  You generally cannot be eligible for healthcare insurance through your employer.  °  How to apply: °Eligibility screenings are held every Tuesday and Wednesday afternoon from 1:00 pm until 4:00 pm. You do not need an appointment  for the interview!  °[facility] [address], [place], [place] 336-631-2330   °[facility]  336-342-8273   °[facility]  336-703-3100   °[facility]  336-570-6415   ° °Behavioral Health Resources in the Community: °Intensive Outpatient Programs °Organization         Address  Phone  Notes  °[facility] [address], [place], [place] 336-878-6098   °[facility] [address], [place], [place] 336-832-9800   °[facility] [address], [place], [place] ° 336-882-2125   °[facility] [address],  °[place], [place] 1-800-853-5163 or 336-641-4981   °Substance Abuse Resources °Organization         Address  Phone  Notes  °[facility]  336-882-2125   °[facility]  336-784-9470   °[facility]  336-285-9073   °[facility]  336-845-3988   °Residential & Outpatient Substance Abuse Program  1-800-659-3381   °Psychological Services °Organization         Address  Phone  Notes  °[facility]  336- 832-9600   °[facility]  336- 378-7881   °[facility] [address], [place] 1-800-853-5163 or 336-641-4981   ° °Mobile Crisis Teams °Organization         Address  Phone  Notes  °[facility], Mobile Crisis Care Unit  1-877-626-1772   °[facility]ive °[facility] ° [address] [place], [place] 336-834-9664   °Sharon DeEsch [address] °[place] 336-554-5454   ° °Self-Help/Support Groups °Organization         Address  Phone             Notes  °[facility] - variety of support groups  336- 373-1402 Call for more information  °Narcotics Anonymous (NA), [facility] [address], °[place]  2 meetings at   this location  ° °Residential Treatment Programs °Organization         Address  Phone  Notes  °[facility] [address],    °[place]  1-866-801-8205   °[facility] ° [address], [place], [place] 704-293-8524   °[facility] [address], [place] 336-845-3988 Admissions: 8am-3pm M-F  °[facility] [address]    °[place], [place] 336-841-1104   °[facility] [address], [place], [place] 336-379-7146   °[facility] [address]  °[place], [place] 336-285-9073   °[facility] Intensive Outpatient [address], [place], [place] 336-852-3033   °[facility] [address]  °[place], [place] 1-877-615-2722 or 336-784-9470   °[facility] ([facility] [address] [place], [place] 336-227-7417 Accepts Medicaid  °[facility] [address]  °[place] 1-800-659-3381 Substance Abuse/Addiction Treatment  ° °[place] Behavioral Health Resources °Organization         Address  Phone  Notes  °[facility]  (888) 581-9988   °Julie Brannon, PhD [address] [place], [place]   (336) 349-5553 or (336) 951-0000   °[facility]   [address] °[place], [place] (336) 349-4454   °[facility] [address], [place], [place] (336) 342-8316 Insurance/Medicaid/sponsorship through [facility]  °[facility] [address]                                    [place], [place] (336) 342-8316 Therapy/tele-psych/case  °[facility] [address]  ° [place], [place] (336) 349-2233    °Dr. Arfeen  (336) 349-4544   °[facility] [place]  [employer] [facility] [place] [facility] 1) [address], [place] °2) [address], [place] °3)  [address], [place] (336) 349-3220 °(336) 342-7768 ° °(336) 342-8140   °[place] [facility] (336) 342-1394 or (336) 342-3537 (After Hours)    ° ° ° °

## 2015-05-19 NOTE — ED Notes (Signed)
Pt ambulates independently and with steady gait at time of discharge. Discharge instructions and follow up information reviewed with patient. No other questions or concerns voiced at this time. RX x 1 given. 

## 2016-01-30 ENCOUNTER — Emergency Department (HOSPITAL_COMMUNITY)
Admission: EM | Admit: 2016-01-30 | Discharge: 2016-01-30 | Disposition: A | Discharge status: Home / Self Care | Payer: BLUE CROSS/BLUE SHIELD | Attending: Emergency Medicine | Admitting: Emergency Medicine

## 2016-01-30 ENCOUNTER — Encounter (HOSPITAL_COMMUNITY): Payer: Self-pay | Admitting: *Deleted

## 2016-01-30 DIAGNOSIS — Y999 Unspecified external cause status: Secondary | ICD-10-CM | POA: Insufficient documentation

## 2016-01-30 DIAGNOSIS — S0181XA Laceration without foreign body of other part of head, initial encounter: Secondary | ICD-10-CM

## 2016-01-30 DIAGNOSIS — J45909 Unspecified asthma, uncomplicated: Secondary | ICD-10-CM | POA: Diagnosis not present

## 2016-01-30 DIAGNOSIS — Y929 Unspecified place or not applicable: Secondary | ICD-10-CM | POA: Insufficient documentation

## 2016-01-30 DIAGNOSIS — Y939 Activity, unspecified: Secondary | ICD-10-CM | POA: Diagnosis not present

## 2016-01-30 DIAGNOSIS — W228XXA Striking against or struck by other objects, initial encounter: Secondary | ICD-10-CM | POA: Diagnosis not present

## 2016-01-30 DIAGNOSIS — S01112A Laceration without foreign body of left eyelid and periocular area, initial encounter: Secondary | ICD-10-CM | POA: Diagnosis not present

## 2016-01-30 MED ORDER — LIDOCAINE HCL (PF) 1 % IJ SOLN
5.0000 mL | Freq: Once | INTRAMUSCULAR | Status: AC
  Administered 2016-01-30: 5 mL
  Filled 2016-01-30: qty 5

## 2016-01-30 NOTE — Discharge Instructions (Signed)
Please read and follow all provided instructions.  Your diagnoses today include:  1. Facial laceration, initial encounter    Tests performed today include: Vital signs. See below for your results today.   Medications prescribed:   Take any prescribed medications only as directed.   Home care instructions:  Follow any educational materials and wound care instructions contained in this packet.   You may shower and wash the area with soap and water, just be sure to pat the area dry and not rub over the stitches. Do no put your stiches underwater (in a bath, pool, or lake). Getting stiches wet can slow down healing and increase your chances of getting an infection. You may apply Bacitracin or Neosporin twice a day for 7 days, and keep the ara clean with  bandage or gauze. Do not apply alcohol or hydrogen peroxide. Cover the area if it draining or weeping.   Follow-up instructions: Suture Removal: Return to the Emergency Department or see your primary care care doctor in 7 days for a recheck of your wound and removal of your sutures or staples.    Return instructions:  Return to the Emergency Department if you have: Fever Worsening pain Worsening swelling of the wound Pus draining from the wound Redness of the skin that moves away from the wound, especially if it streaks away from the affected area  Any other emergent concerns  Your vital signs today were: BP 150/88 (BP Location: Right Arm)    Pulse 65    Temp 97.6 F (36.4 C) (Oral)    Resp 18    SpO2 99%  If your blood pressure (BP) was elevated above 135/85 this visit, please have this repeated by your doctor within one month. --------------

## 2016-01-30 NOTE — ED Provider Notes (Signed)
[facility]EM[facility]ENC[facility] DEPT Provider Note   CSN: [id number] Arrival date & time: 01/30/16  1323   By signing my name below, I, Valentino SaxonBianca Contreras, attest that this documentation has been prepared under the direction and in the presence of  Audry Piliyler Christiann Hagerty PA-C Electronically Signed: Valentino SaxonBianca Contreras, ED Scribe. 01/30/16. 1:53 PM.  History   Chief Complaint Chief Complaint  Patient presents with  . Laceration   The history is provided by the patient. No language interpreter was used.    HPI Comments: Todd Barajas is a 20 y.o. male who presents to the Emergency Department complaining of laceration to left eyebrow occurred an hour ago. Pt associates soreness to affected area, which he rates as 2/10 currently. Tetanus vaccine UTD. Pt denies taking anti-coagulants.  No visual disturbances. No other symptoms noted.   Past Medical History:  Diagnosis Date  . Asthma     There are no active problems to display for this patient.  History reviewed. No pertinent surgical history.   Home Medications    Prior to Admission medications   Medication Sig Start Date End Date Taking? Authorizing Provider  amoxicillin (AMOXIL) 875 MG tablet Take 875 mg by mouth once.    Historical Provider, MD  ibuprofen (ADVIL,MOTRIN) 800 MG tablet Take 1 tablet (800 mg total) by mouth 3 (three) times daily. 05/19/15   Cheri FowlerKayla Rose, PA-C    Family History History reviewed. No pertinent family history.  Social History Social History  Substance Use Topics  . Smoking status: Never Smoker  . Smokeless tobacco: Not on file  . Alcohol use No   Allergies   Review of patient's allergies indicates no known allergies.   Review of Systems Review of Systems  Eyes: Negative for pain.  Skin: Positive for wound (left eyebrow).   Physical Exam Updated Vital Signs BP 150/88 (BP Location: Right Arm)   Pulse 65   Temp 97.6 F (36.4 C) (Oral)   Resp 18   SpO2 99%   Physical Exam  Constitutional: He is oriented to person,  place, and time. Vital signs are normal. He appears well-developed and well-nourished.  HENT:  Head: Normocephalic and atraumatic.  Right Ear: Hearing normal.  Left Ear: Hearing normal.  Eyes: Conjunctivae and EOM are normal. Pupils are equal, round, and reactive to light. Right eye exhibits no discharge. Left eye exhibits no discharge.  0.5cm laceration noted on left outer aspect of left eyelid. Bleeding controlled. EOM intact and without pain. Pupils reactive.   Cardiovascular: Normal rate and regular rhythm.   Pulmonary/Chest: Effort normal. No respiratory distress.  Neurological: He is alert and oriented to person, place, and time. Coordination normal.  Skin: Skin is warm and dry. No rash noted. He is not diaphoretic. No erythema.  Psychiatric: He has a normal mood and affect. His speech is normal and behavior is normal. Thought content normal.  Nursing note and vitals reviewed.  ED Treatments / Results   DIAGNOSTIC STUDIES: Oxygen Saturation is 99% on RA, normal by my interpretation.    COORDINATION OF CARE: 1:55 PM Discussed treatment plan with pt at bedside and pt agreed to plan.  Labs (all labs ordered are listed, but only abnormal results are displayed) Labs Reviewed - No data to display  EKG  EKG Interpretation None      Radiology No results found.  Procedures Procedures (including critical care time)  LACERATION REPAIR PROCEDURE NOTE The patient's identification was confirmed and consent was obtained. This procedure was performed by Audry Piliyler Jewelene Mairena PA-C at 2:05  PM. Site: left outer aspect of eyelid  Sterile procedures observed Anesthetic used (type and amt): Lidocaine 1% w/o epi, 1mL Suture type/size: 6-0 prolene Length: 0.5 cm # of Sutures: 1 Technique: simple interrupted  Complexity: simple Antibx ointment applied Tetanus UTD  Site anesthetized, irrigated with NS, explored without evidence of foreign body, wound well approximated, site covered with dry,  sterile dressing.  Patient tolerated procedure well without complications. Instructions for care discussed verbally and patient provided with additional written instructions for homecare and f/u.   Medications Ordered in ED Medications - No data to display   Initial Impression / Assessment and Plan / ED Course  I have reviewed the triage vital signs and the nursing notes.  Pertinent labs & imaging results that were available during my care of the patient were reviewed by me and considered in my medical decision making (see chart for details).  Clinical Course  I have reviewed the relevant previous healthcare records. I obtained HPI from historian.Patient discussed with supervising physician  ED Course:  Assessment: Tetanus UTD. Laceration occurred < 12 hours prior to repair. Discussed laceration care with pt and answered questions. Pt to f-u for suture removal in 7 days and wound check sooner should there be signs of dehiscence or infection. Pt is hemodynamically stable with no complaints prior to dc.    Disposition/Plan:  DC Home Additional Verbal discharge instructions given and discussed with patient.  Pt Instructed to f/u with PCP in the next week for evaluation and treatment of symptoms. Return precautions given Pt acknowledges and agrees with plan  Supervising Physician Linwood D[place]bles, MD    Final Clinical Impressions(s) / ED Diagnoses   Final diagnoses:  Facial laceration, initial encounter    New Prescriptions New Prescriptions   No medications on file   I personally performed the services described in this documentation, which was scribed in my presence. The recorded information has been reviewed and is accurate.    Audry Pili, PA-C 01/30/16 1521    Linwood Dibbles, MD 01/31/16 [phone]
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
## 2016-01-30 NOTE — ED Notes (Signed)
Declined W/C at D/C and was escorted to lobby by RN. 

## 2017-03-15 ENCOUNTER — Emergency Department (HOSPITAL_COMMUNITY): Payer: BLUE CROSS/BLUE SHIELD

## 2017-03-15 ENCOUNTER — Emergency Department (HOSPITAL_COMMUNITY)
Admission: EM | Admit: 2017-03-15 | Discharge: 2017-03-15 | Disposition: A | Discharge status: Home / Self Care | Payer: BLUE CROSS/BLUE SHIELD | Attending: Emergency Medicine | Admitting: Emergency Medicine

## 2017-03-15 ENCOUNTER — Encounter (HOSPITAL_COMMUNITY): Payer: Self-pay | Admitting: Emergency Medicine

## 2017-03-15 ENCOUNTER — Other Ambulatory Visit: Payer: Self-pay

## 2017-03-15 DIAGNOSIS — R05 Cough: Secondary | ICD-10-CM | POA: Diagnosis present

## 2017-03-15 DIAGNOSIS — J4520 Mild intermittent asthma, uncomplicated: Secondary | ICD-10-CM | POA: Diagnosis not present

## 2017-03-15 DIAGNOSIS — Z79899 Other long term (current) drug therapy: Secondary | ICD-10-CM | POA: Insufficient documentation

## 2017-03-15 MED ORDER — ALBUTEROL SULFATE HFA 108 (90 BASE) MCG/ACT IN AERS: 1.0000 | INHALATION_SPRAY | Freq: Four times a day (QID) | RESPIRATORY_TRACT | 0 refills | Status: DC | PRN

## 2017-03-15 MED ORDER — ALBUTEROL SULFATE HFA 108 (90 BASE) MCG/ACT IN AERS
1.0000 | INHALATION_SPRAY | Freq: Once | RESPIRATORY_TRACT | Status: AC
Start: 2017-03-15 — End: 2017-03-15
  Administered 2017-03-15: 2 via RESPIRATORY_TRACT
  Filled 2017-03-15: qty 6.7

## 2017-03-15 NOTE — ED Notes (Signed)
Bed: [id number] Expected date:  Expected time:  Means of arrival:  Comments: EMS/flu sx

## 2017-03-15 NOTE — Discharge Instructions (Signed)
Please establish primary care for regular management of your asthma. You can call the number in the back of this paperwork or on the back of your insurance card for primary care providers in the area. You can use 1-2 puffs of the inhaler every 6 hours as needed for shortness of breath. Return to the ER for shortness of breath not improved with inhaler, or new or concerning symptoms.

## 2017-03-15 NOTE — ED Notes (Signed)
Per EMS pt complaint of productive cough for a week.

## 2017-03-15 NOTE — ED Provider Notes (Signed)
[facility]-EMERGENCY DEPT Provider Note   CSN: [id number] Arrival date & time: 03/15/17  [id number]     History   Chief Complaint Chief Complaint  Patient presents with  . Cough    HPI Todd Barajas is a 21 y.o. male with past medical history of asthma, Todd to the ED with 1 month of cough productive of white phlegm. Cough is worse at night. Treated with tylenol cold and flu with improvement in symptoms. Reports intermittent right-sided anterior chest wall pain with breathing.  Associated nasal congestion.  Denies ear pain, sore throat, difficulty breathing or swallowing, abd pain. No recent trauma or injury. States he has not had problems with his asthma in many years and used to use an inhaler, however has not needed that in some time.  No history of DVT/PE, no recent travel, no recent trauma/surgery, no hx cancer.  The history is provided by the patient.    Past Medical History:  Diagnosis Date  . Asthma     There are no active problems to display for this patient.   History reviewed. No pertinent surgical history.     Home Medications    Prior to Admission medications   Medication Sig Start Date End Date Taking? Authorizing Provider  albuterol (PROVENTIL HFA;VENTOLIN HFA) 108 (90 Base) MCG/ACT inhaler Inhale 1-2 puffs into the lungs every 6 (six) hours as needed for wheezing or shortness of breath. 03/15/17   Bennette Hasty, [place] N, PA-Barajas  amoxicillin (AMOXIL) 875 MG tablet Take 875 mg by mouth once.    [provider]  ibuprofen (ADVIL,MOTRIN) 800 MG tablet Take 1 tablet (800 mg total) by mouth 3 (three) times daily. 05/19/15   Cheri Fowlerose, Kayla, PA-Barajas    Family History No family history on file.  Social History Social History   Tobacco Use  . Smoking status: Never Smoker  Substance Use Topics  . Alcohol use: No  . Drug use: No     Allergies   Patient has no known allergies.   Review of Systems Review of Systems  Constitutional:  Negative for chills and fever.  HENT: Positive for congestion. Negative for ear pain, sore throat and trouble swallowing.   Respiratory: Positive for cough. Negative for shortness of breath and wheezing.   Gastrointestinal: Negative for abdominal pain.     Physical Exam Updated Vital Signs BP 140/76 (BP Location: Left Arm)   Pulse (!) 58   Temp 98.3 F (36.8 Barajas) (Oral)   Resp 16   SpO2 99%   Physical Exam  Constitutional: He appears well-developed and well-nourished. No distress.  HENT:  Head: Normocephalic and atraumatic.  Right Ear: Hearing, tympanic membrane, external ear and ear canal normal.  Left Ear: Hearing, tympanic membrane, external ear and ear canal normal.  Nose: Nose normal.  Mouth/Throat: Uvula is midline and oropharynx is clear and moist.  Tolerating secretions.  Eyes: Conjunctivae are normal.  Neck: Normal range of motion. Neck supple. No JVD present. No tracheal deviation present.  Cardiovascular: Regular rhythm, normal heart sounds and intact distal pulses.  Slightly bradycardic  Pulmonary/Chest: Effort normal and breath sounds normal. No stridor. No respiratory distress. He has no wheezes. He has no rales. He exhibits tenderness (right lower anterior chest wall, mild, no crepitus).  No Increased work of breathing.  Abdominal: Soft. Bowel sounds are normal. There is no tenderness. There is negative Murphy's sign.  Lymphadenopathy:    He has no cervical adenopathy.  Psychiatric: He has a normal mood and affect.  His behavior is normal.  Nursing note and vitals reviewed.    ED Treatments / Results  Labs (all labs ordered are listed, but only abnormal results are displayed) Labs Reviewed - No data to display  EKG  EKG Interpretation None       Radiology Dg Chest 2 View  Result Date: 03/15/2017 CLINICAL DATA:  One month of cough and shortness of breath. Nonsmoker. EXAM: CHEST  2 VIEW COMPARISON:  Chest x-ray of May 19, 2015 FINDINGS: The lungs  are hyperinflated and clear. The heart and pulmonary vascularity are normal. The mediastinum is normal in width. The trachea is midline. The bony thorax exhibits no acute abnormality. IMPRESSION: Hyperinflation consistent with reactive airway disease or acute bronchitis. There is no alveolar pneumonia nor other acute cardiopulmonary abnormality. Electronically Signed   By: David  [place] M.D.   On: 03/15/2017 09:07    Procedures Procedures (including critical care time)  Medications Ordered in ED Medications  albuterol (PROVENTIL HFA;VENTOLIN HFA) 108 (90 Base) MCG/ACT inhaler 1-2 puff (2 puffs Inhalation Given 03/15/17 0925)     Initial Impression / Assessment and Plan / ED Course  I have reviewed the triage vital signs and the nursing notes.  Pertinent labs & imaging results that were available during my care of the patient were reviewed by me and considered in my medical decision making (see chart for details).     Pt Todd with symptoms consistent with asthma. Pt with hx of asthma, however has not had symptoms in some time. Lungs CTAB, no inc work of breathing. ENT exam benign. O2 sat 98% on RA, no tachypnea. CXR consistent with reactive airway disease. PERC neg. Will discharge with albuterol inhaler and recommend pt establish primary care for chronic management. Pt is safe for discharge.  Discussed results, findings, treatment and follow up. Patient advised of return precautions. Patient verbalized understanding and agreed with plan.   Final Clinical Impressions(s) / ED Diagnoses   Final diagnoses:  Mild intermittent asthma without complication    ED Discharge Orders        Ordered    albuterol (PROVENTIL HFA;VENTOLIN HFA) 108 (90 Base) MCG/ACT inhaler  Every 6 hours PRN     03/15/17 0917       Channin Agustin, [place] N, PA-Barajas 03/15/17 0945    Raeford RazorKohut, Stephen, MD 03/16/17 [phone]

## 2018-05-06 ENCOUNTER — Encounter (HOSPITAL_COMMUNITY): Payer: Self-pay

## 2018-05-06 ENCOUNTER — Emergency Department (HOSPITAL_COMMUNITY)
Admission: EM | Admit: 2018-05-06 | Discharge: 2018-05-06 | Disposition: A | Discharge status: Home / Self Care | Payer: BLUE CROSS/BLUE SHIELD | Attending: Emergency Medicine | Admitting: Emergency Medicine

## 2018-05-06 DIAGNOSIS — J029 Acute pharyngitis, unspecified: Secondary | ICD-10-CM | POA: Insufficient documentation

## 2018-05-06 DIAGNOSIS — F17210 Nicotine dependence, cigarettes, uncomplicated: Secondary | ICD-10-CM | POA: Insufficient documentation

## 2018-05-06 DIAGNOSIS — Z79899 Other long term (current) drug therapy: Secondary | ICD-10-CM | POA: Insufficient documentation

## 2018-05-06 DIAGNOSIS — J45909 Unspecified asthma, uncomplicated: Secondary | ICD-10-CM | POA: Insufficient documentation

## 2018-05-06 LAB — GROUP A STREP BY PCR: Group A Strep by PCR: NOT DETECTED

## 2018-05-06 NOTE — Discharge Instructions (Signed)
Try over the counter medicines for pain Return if worsening

## 2018-05-06 NOTE — ED Provider Notes (Signed)
[facility] EMERGENCY DEPARTMENT Provider Note   CSN: [id number] Arrival date & time: 05/06/18  2010     History   Chief Complaint Chief Complaint  Patient presents with  . Sore Throat    HPI Todd Barajas is a 23 y.o. male presents with a sore throat.  Past medical history significant for asthma, smoking, marijuana use.  The patient states that he has had a sore throat for the past 2 days.  It is a throbbing sensation.  He denies feeling sick.  No fever, ear pain, runny nose, cough.  It seemed to start after smoking black and milds.  He also has had a couple episodes of heartburn.  He tried TheraFlu which helped his symptoms.  He is unsure of any sick contacts.  He took a video yesterday and felt bumps in the back of his throat and became concerned so wanted to come to the ED to get checked out.  HPI  Past Medical History:  Diagnosis Date  . Asthma     There are no active problems to display for this patient.   History reviewed. No pertinent surgical history.      Home Medications    Prior to Admission medications   Medication Sig Start Date End Date Taking? Authorizing Provider  albuterol (PROVENTIL HFA;VENTOLIN HFA) 108 (90 Base) MCG/ACT inhaler Inhale 1-2 puffs into the lungs every 6 (six) hours as needed for wheezing or shortness of breath. 03/15/17   Robinson, [place] N, PA-C  amoxicillin (AMOXIL) 875 MG tablet Take 875 mg by mouth once.    [provider]  ibuprofen (ADVIL,MOTRIN) 800 MG tablet Take 1 tablet (800 mg total) by mouth 3 (three) times daily. 05/19/15   Cheri Fowler, PA-C    Family History History reviewed. No pertinent family history.  Social History Social History   Tobacco Use  . Smoking status: Current Every Day Smoker    Types: Cigars  . Smokeless tobacco: Never Used  Substance Use Topics  . Alcohol use: No  . Drug use: Yes    Types: Marijuana     Allergies   Patient has no known allergies.   Review of  Systems Review of Systems  Constitutional: Negative for fever.  HENT: Positive for sore throat. Negative for congestion and rhinorrhea.   Respiratory: Negative for cough.      Physical Exam Updated Vital Signs BP (!) 156/88   Pulse 66   Temp 98.2 F (36.8 C) (Oral)   Resp 16   SpO2 99%   Physical Exam Vitals signs and nursing note reviewed.  Constitutional:      General: He is not in acute distress.    Appearance: He is well-developed.     Comments: Calm, cooperative.  Well-appearing.  HENT:     Head: Normocephalic and atraumatic.     Right Ear: Tympanic membrane normal.     Left Ear: Tympanic membrane normal.     Nose: Nose normal.     Mouth/Throat:     Lips: Pink.     Mouth: Mucous membranes are moist.     Dentition: Normal dentition.     Tongue: No lesions.     Pharynx: Oropharynx is clear. Uvula midline. No pharyngeal swelling.     Tonsils: No tonsillar exudate or tonsillar abscesses.  Eyes:     General: No scleral icterus.       Right eye: No discharge.        Left eye: No discharge.  Conjunctiva/sclera: Conjunctivae normal.     Pupils: Pupils are equal, round, and reactive to light.  Neck:     Musculoskeletal: Normal range of motion.  Cardiovascular:     Rate and Rhythm: Normal rate.  Pulmonary:     Effort: Pulmonary effort is normal. No respiratory distress.  Abdominal:     General: There is no distension.  Skin:    General: Skin is warm and dry.  Neurological:     Mental Status: He is alert and oriented to person, place, and time.  Psychiatric:        Behavior: Behavior normal.      ED Treatments / Results  Labs (all labs ordered are listed, but only abnormal results are displayed) Labs Reviewed  GROUP A STREP BY PCR    EKG None  Radiology No results found.  Procedures Procedures (including critical care time)  Medications Ordered in ED Medications - No data to display   Initial Impression / Assessment and Plan / ED Course  I  have reviewed the triage vital signs and the nursing notes.  Pertinent labs & imaging results that were available during my care of the patient were reviewed by me and considered in my medical decision making (see chart for details).  23 year old male with a sore throat for 2 days.  Unclear etiology.  He reports an acute onset but does not have any infectious symptoms.  His exam is unremarkable.  Strep was negative.  Advised supportive care at this time and to return for reevaluation if not improving in the next week.  Final Clinical Impressions(s) / ED Diagnoses   Final diagnoses:  Pharyngitis, unspecified etiology    ED Discharge Orders    None       Beryle Quant 05/06/18 2151    Gerhard Munch, MD 05/06/18 2313

## 2018-05-06 NOTE — ED Notes (Signed)
Pt stable, ambulatory, states understanding of discharge instructions 

## 2018-05-08 ENCOUNTER — Encounter (HOSPITAL_COMMUNITY): Payer: Self-pay

## 2018-05-08 ENCOUNTER — Emergency Department (HOSPITAL_COMMUNITY)
Admission: EM | Admit: 2018-05-08 | Discharge: 2018-05-08 | Disposition: A | Discharge status: Home / Self Care | Payer: Self-pay | Attending: Emergency Medicine | Admitting: Emergency Medicine

## 2018-05-08 ENCOUNTER — Emergency Department (HOSPITAL_COMMUNITY): Payer: Self-pay

## 2018-05-08 DIAGNOSIS — E041 Nontoxic single thyroid nodule: Secondary | ICD-10-CM | POA: Insufficient documentation

## 2018-05-08 DIAGNOSIS — Z79899 Other long term (current) drug therapy: Secondary | ICD-10-CM | POA: Insufficient documentation

## 2018-05-08 DIAGNOSIS — J029 Acute pharyngitis, unspecified: Secondary | ICD-10-CM | POA: Insufficient documentation

## 2018-05-08 DIAGNOSIS — F17290 Nicotine dependence, other tobacco product, uncomplicated: Secondary | ICD-10-CM | POA: Insufficient documentation

## 2018-05-08 DIAGNOSIS — J45909 Unspecified asthma, uncomplicated: Secondary | ICD-10-CM | POA: Insufficient documentation

## 2018-05-08 DIAGNOSIS — M542 Cervicalgia: Secondary | ICD-10-CM | POA: Insufficient documentation

## 2018-05-08 LAB — CBC WITH DIFFERENTIAL/PLATELET
Abs Immature Granulocytes: 0.01 10*3/uL (ref 0.00–0.07)
BASOS ABS: 0 10*3/uL (ref 0.0–0.1)
BASOS PCT: 0 %
EOS ABS: 0.1 10*3/uL (ref 0.0–0.5)
Eosinophils Relative: 2 %
HCT: 44.6 % (ref 39.0–52.0)
Hemoglobin: 14.6 g/dL (ref 13.0–17.0)
Immature Granulocytes: 0 %
Lymphocytes Relative: 32 %
Lymphs Abs: 2.1 10*3/uL (ref 0.7–4.0)
MCH: 27.2 pg (ref 26.0–34.0)
MCHC: 32.7 g/dL (ref 30.0–36.0)
MCV: 83.2 fL (ref 80.0–100.0)
Monocytes Absolute: 0.6 10*3/uL (ref 0.1–1.0)
Monocytes Relative: 8 %
NRBC: 0 % (ref 0.0–0.2)
Neutro Abs: 3.9 10*3/uL (ref 1.7–7.7)
Neutrophils Relative %: 58 %
PLATELETS: 256 10*3/uL (ref 150–400)
RBC: 5.36 MIL/uL (ref 4.22–5.81)
RDW: 12.1 % (ref 11.5–15.5)
WBC: 6.7 10*3/uL (ref 4.0–10.5)

## 2018-05-08 LAB — BASIC METABOLIC PANEL
Anion gap: 7 (ref 5–15)
BUN: 10 mg/dL (ref 6–20)
CALCIUM: 8.7 mg/dL — AB (ref 8.9–10.3)
CO2: 22 mmol/L (ref 22–32)
CREATININE: 0.99 mg/dL (ref 0.61–1.24)
Chloride: 107 mmol/L (ref 98–111)
GLUCOSE: 92 mg/dL (ref 70–99)
Potassium: 3.6 mmol/L (ref 3.5–5.1)
Sodium: 136 mmol/L (ref 135–145)

## 2018-05-08 MED ORDER — IOPAMIDOL (ISOVUE-300) INJECTION 61%
75.0000 mL | Freq: Once | INTRAVENOUS | Status: AC | PRN
  Administered 2018-05-08: 75 mL via INTRAVENOUS

## 2018-05-08 NOTE — ED Triage Notes (Signed)
Pt was seen here two days ago for sore throat, ruled out strep, now having pain on the L sided of his neck and he want to know what it is.

## 2018-05-08 NOTE — ED Notes (Signed)
Pt verbalized understanding to follow up with ENT. Given school note. VSS

## 2018-05-08 NOTE — ED Notes (Signed)
Pt ambulatory to bathroom

## 2018-05-08 NOTE — ED Notes (Signed)
Patient transported to CT 

## 2018-05-08 NOTE — Discharge Instructions (Addendum)
Please follow up with a primary doctor and ENT Return if worsening

## 2018-05-08 NOTE — ED Provider Notes (Signed)
[facility] Provider [facility]   CSN: [id number] Arrival date & time: 05/08/18  0553     History   Chief Complaint Chief Complaint  Patient presents with  . Neck Pain    HPI Todd Barajas is a 23 y.o. male who presents with a sore throat/neck pain. PMH signficant for asthma, smoking. He was seen in the ED 2 days ago by me for sore throat which had been going on 2 days. He states that he still has a sore throat and it is worse now. He feels is more on the left side but both side hurt. It feels better with Chloraseptic but this is only temporary relief. He cut out smoking and it still hurts. He has difficulty describing it but it feels like a "mosquito" on him. No fever, chills, URI symptoms, chest pain, SOB, difficulty swallowing.  HPI  Past Medical History:  Diagnosis Date  . Asthma     There are no active problems to display for this patient.   History reviewed. No pertinent surgical history.      Home Medications    Prior to Admission medications   Medication Sig Start Date End Date Taking? Authorizing Provider  albuterol (PROVENTIL HFA;VENTOLIN HFA) 108 (90 Base) MCG/ACT inhaler Inhale 1-2 puffs into the lungs every 6 (six) hours as needed for wheezing or shortness of breath. 03/15/17   Robinson, [place] N, PA-C  amoxicillin (AMOXIL) 875 MG tablet Take 875 mg by mouth once.    [provider]  ibuprofen (ADVIL,MOTRIN) 800 MG tablet Take 1 tablet (800 mg total) by mouth 3 (three) times daily. 05/19/15   Cheri Fowlerose, Kayla, PA-C    Family History No family history on file.  Social History Social History   Tobacco Use  . Smoking status: Current Every Day Smoker    Types: Cigars  . Smokeless tobacco: Never Used  Substance Use Topics  . Alcohol use: No  . Drug use: Yes    Types: Marijuana     Allergies   Patient has no known allergies.   Review of Systems Review of Systems  Constitutional: Negative for fever.  HENT:  Positive for sore throat.      Physical Exam Updated Vital Signs BP (!) 145/85   Pulse 70   Resp 16   SpO2 99%   Physical Exam Vitals signs and nursing note reviewed.  Constitutional:      General: He is not in acute distress.    Appearance: Normal appearance. He is well-developed.     Comments: Calm, cooperative, NAD  HENT:     Head: Normocephalic and atraumatic.     Mouth/Throat:     Lips: Pink.     Mouth: Mucous membranes are moist.     Dentition: Normal dentition.     Pharynx: Oropharynx is clear. Uvula midline. No pharyngeal swelling, oropharyngeal exudate, posterior oropharyngeal erythema or uvula swelling.     Tonsils: No tonsillar exudate or tonsillar abscesses.     Comments: Large tonsil crypts without obvious stones Eyes:     General: No scleral icterus.       Right eye: No discharge.        Left eye: No discharge.     Conjunctiva/sclera: Conjunctivae normal.     Pupils: Pupils are equal, round, and reactive to light.  Neck:     Musculoskeletal: Normal range of motion.  Cardiovascular:     Rate and Rhythm: Normal rate.  Pulmonary:     Effort:  Pulmonary effort is normal. No respiratory distress.  Abdominal:     General: There is no distension.  Skin:    General: Skin is warm and dry.  Neurological:     Mental Status: He is alert and oriented to person, place, and time.  Psychiatric:        Behavior: Behavior normal.      ED Treatments / Results  Labs (all labs ordered are listed, but only abnormal results are displayed) Labs Reviewed  BASIC METABOLIC PANEL - Abnormal; Notable for the following components:      Result Value   Calcium 8.7 (*)    All other components within normal limits  CBC WITH DIFFERENTIAL/PLATELET    EKG None  Radiology Ct Soft Tissue Neck W Contrast  Result Date: 05/08/2018 CLINICAL DATA:  Sore throat now with left-sided neck pain EXAM: CT NECK WITH CONTRAST TECHNIQUE: Multidetector CT imaging of the neck was performed  using the standard protocol following the bolus administration of intravenous contrast. CONTRAST:  66mL ISOVUE-300 IOPAMIDOL (ISOVUE-300) INJECTION 61% COMPARISON:  None. FINDINGS: Pharynx and larynx: Normal. No mass or swelling. Salivary glands: No inflammation, mass, or stone. Thyroid: 4 mm right-sided nodule, solitary. Lymph nodes: None enlarged or abnormal density. Vascular: Negative. Limited intracranial: Negative. Visualized orbits: Negative. Mastoids and visualized paranasal sinuses: Clear. Skeleton: No acute or aggressive process. Upper chest: Negative IMPRESSION: 1. No acute finding or explanation for neck pain. 2. 4 mm right thyroid nodule. Electronically Signed   By: Marnee Spring M.D.   On: 05/08/2018 09:28    Procedures Procedures (including critical care time)  Medications Ordered in ED Medications - No data to display   Initial Impression / Assessment and Plan / ED Course  I have reviewed the triage vital signs and the nursing notes.  Pertinent labs & imaging results that were available during my care of the patient were reviewed by me and considered in my medical decision making (see chart for details).  23 year old male presents with ongoing sore throat for ~4 days. He is very concerned however exam is unremarkable. Vitals are normal. Will obtain labs and CT soft tissue neck.  CT shows 21mm thyroid nodule on the right side, likely incidental. Labs are normal. CM was consulted and he was arranged a primary care appt this week. He was also given a referral to ENT and encouraged to f/u.  Final Clinical Impressions(s) / ED Diagnoses   Final diagnoses:  Sore throat  Neck pain  Thyroid nodule    ED Discharge Orders    None       Bethel Born, PA-C 05/08/18 1746    Alvira Monday, MD 05/11/18 0001

## 2018-05-08 NOTE — Discharge Planning (Signed)
Todd Altidor J. Lucretia Roers, RN, BSN, [place] [phone]  [facility] set up appointment with Primary Care at [facility] on 1/22 @ 2:30.  Spoke with pt at bedside and advised to please arrive 15 min early and take a picture ID and your current medications.  Pt verbalizes understanding of keeping appointment.

## 2018-05-10 ENCOUNTER — Emergency Department (HOSPITAL_COMMUNITY)
Admission: EM | Admit: 2018-05-10 | Discharge: 2018-05-10 | Disposition: A | Discharge status: Home / Self Care | Payer: BLUE CROSS/BLUE SHIELD | Attending: Emergency Medicine | Admitting: Emergency Medicine

## 2018-05-10 ENCOUNTER — Other Ambulatory Visit: Payer: Self-pay

## 2018-05-10 DIAGNOSIS — F17210 Nicotine dependence, cigarettes, uncomplicated: Secondary | ICD-10-CM | POA: Insufficient documentation

## 2018-05-10 DIAGNOSIS — J45909 Unspecified asthma, uncomplicated: Secondary | ICD-10-CM | POA: Insufficient documentation

## 2018-05-10 DIAGNOSIS — R1310 Dysphagia, unspecified: Secondary | ICD-10-CM | POA: Diagnosis present

## 2018-05-10 MED ORDER — HYDROXYZINE HCL 25 MG PO TABS: 25.0000 mg | ORAL_TABLET | Freq: Three times a day (TID) | ORAL | 0 refills | Status: DC | PRN

## 2018-05-10 NOTE — ED Provider Notes (Signed)
[facility]   CSN: [id number] Arrival date & time: 05/10/18  0419     History   Chief Complaint Chief Complaint  Patient presents with  . Trouble Swallowing    HPI Todd Barajas is a 23 y.o. male.  The history is provided by the patient and medical records. No language interpreter was used.     23 year old male with history of asthma presenting complaining of trouble swallowing.  Patient was diagnosed with pharyngitis on 05/06/2018 when he was complaining of having sore throat.  He was seen again on 05/08/2018 with recurrent sore throat, and neck CT soft tissue was obtained that was unremarkable, incidentally a 4 mm right thyroid nodule was noted, likely incidental.  Patient does have an appointment with PCP as well as ENT that was recently scheduled.  He is here today because last night while researching about his thyroid, he became short of breath.  States that he had to take several deep breath in order to calm himself.  This was concerning for him.  He does not complain of any trouble swallowing his saliva, having any significant cough or hemoptysis.  He still endorse throat discomfort for the past 5 days.  He admits to using marijuana but states he is trying to decrease his usage.  Denies any significant alcohol use.  Denies any abnormal weight changes, or temperature changes.  Denies having dry skin or hair falling off.   Past Medical History:  Diagnosis Date  . Asthma     There are no active problems to display for this patient.   No past surgical history on file.      Home Medications    Prior to Admission medications   Medication Sig Start Date End Date Taking? Authorizing Provider  albuterol (PROVENTIL HFA;VENTOLIN HFA) 108 (90 Base) MCG/ACT inhaler Inhale 1-2 puffs into the lungs every 6 (six) hours as needed for wheezing or shortness of breath. 03/15/17   Robinson, [place] N, PA-C  amoxicillin (AMOXIL) 875 MG  tablet Take 875 mg by mouth once.    [provider]  ibuprofen (ADVIL,MOTRIN) 800 MG tablet Take 1 tablet (800 mg total) by mouth 3 (three) times daily. 05/19/15   Cheri Fowlerose, Kayla, PA-C    Family History No family history on file.  Social History Social History   Tobacco Use  . Smoking status: Current Every Day Smoker    Types: Cigars  . Smokeless tobacco: Never Used  Substance Use Topics  . Alcohol use: No  . Drug use: Yes    Types: Marijuana     Allergies   Patient has no known allergies.   Review of Systems Review of Systems  All other systems reviewed and are negative.    Physical Exam Updated Vital Signs BP (!) 134/98 (BP Location: Right Arm)   Pulse (!) 52   Temp 97.7 F (36.5 C) (Oral)   Resp 18   Ht 5\' 11"  (1.803 m)   Wt 86.2 kg   BMI 26.50 kg/m   Physical Exam Vitals signs and nursing note reviewed.  Constitutional:      General: He is not in acute distress.    Appearance: He is well-developed.  HENT:     Head: Atraumatic.     Mouth/Throat:     Mouth: Mucous membranes are moist.     Pharynx: No oropharyngeal exudate or posterior oropharyngeal erythema.  Eyes:     Conjunctiva/sclera: Conjunctivae normal.  Neck:  Musculoskeletal: Neck supple. No neck rigidity or muscular tenderness.     Vascular: No carotid bruit.  Cardiovascular:     Rate and Rhythm: Normal rate and regular rhythm.  Pulmonary:     Effort: Pulmonary effort is normal.     Breath sounds: Normal breath sounds.  Abdominal:     Palpations: Abdomen is soft.     Tenderness: There is no abdominal tenderness.  Lymphadenopathy:     Cervical: No cervical adenopathy.  Skin:    Findings: No rash.  Neurological:     Mental Status: He is alert.      ED Treatments / Results  Labs (all labs ordered are listed, but only abnormal results are displayed) Labs Reviewed - No data to display  EKG None  Radiology Ct Soft Tissue Neck W Contrast  Result Date:  05/08/2018 CLINICAL DATA:  Sore throat now with left-sided neck pain EXAM: CT NECK WITH CONTRAST TECHNIQUE: Multidetector CT imaging of the neck was performed using the standard protocol following the bolus administration of intravenous contrast. CONTRAST:  11mL ISOVUE-300 IOPAMIDOL (ISOVUE-300) INJECTION 61% COMPARISON:  None. FINDINGS: Pharynx and larynx: Normal. No mass or swelling. Salivary glands: No inflammation, mass, or stone. Thyroid: 4 mm right-sided nodule, solitary. Lymph nodes: None enlarged or abnormal density. Vascular: Negative. Limited intracranial: Negative. Visualized orbits: Negative. Mastoids and visualized paranasal sinuses: Clear. Skeleton: No acute or aggressive process. Upper chest: Negative IMPRESSION: 1. No acute finding or explanation for neck pain. 2. 4 mm right thyroid nodule. Electronically Signed   By: Marnee Spring M.D.   On: 05/08/2018 09:28    Procedures Procedures (including critical care time)  Medications Ordered in ED Medications - No data to display   Initial Impression / Assessment and Plan / ED Course  I have reviewed the triage vital signs and the nursing notes.  Pertinent labs & imaging results that were available during my care of the patient were reviewed by me and considered in my medical decision making (see chart for details).     BP 121/76   Pulse (!) 51   Temp 97.7 F (36.5 C) (Oral)   Resp 16   Ht 5\' 11"  (1.803 m)   Wt 86.2 kg   SpO2 99%   BMI 26.50 kg/m    Final Clinical Impressions(s) / ED Diagnoses   Final diagnoses:  Dysphagia, unspecified type    ED Discharge Orders         Ordered    hydrOXYzine (ATARAX/VISTARIL) 25 MG tablet  Every 8 hours PRN     05/10/18 0701         6:59 AM Patient with persistent sore throat and now concerning of having to take deep breath after he did some research about his thyroid nodule that was incidentally found on recent neck soft tissue CT.  He does not have any evidence of airway  compromise.  He is able to speak in complete sentences, he is able to swallow his saliva without difficulty, vital signs stable, no hypoxia, throat examination unremarkable.  Suspect anxiety worsening his symptoms.  He does have a an appointment to follow-up with ENT in the near future which I encourage patient to follow-up.  I also recommend gargle salt water for sore throat.  He does not have any other infectious symptoms.  No evidence of goiter on exam.  No findings to suggest thyrotoxicosis or myxedema coma.   Fayrene Helper, PA-C 05/10/18 8032    Glynn Octave, MD 05/10/18 [phone]

## 2018-05-10 NOTE — Discharge Instructions (Addendum)
Follow up with ENT specialist for further evaluation of your throat discomfort and thyroid nodule.  Gargle with salt water for throat discomfort.  Take vistaril as needed for anxiety.

## 2018-05-10 NOTE — ED Triage Notes (Signed)
Third time pt has been seen here in past week for same issue. Pt reports trouble swallowing, was dx with pharyngitis 05/06/2018. Had CT Soft Tissue Neck 2 days ago, workup was unremarkable. VSS. Pt in NAD.

## 2018-05-15 ENCOUNTER — Inpatient Hospital Stay: Payer: Self-pay | Admitting: Family Medicine

## 2018-10-17 ENCOUNTER — Emergency Department (HOSPITAL_COMMUNITY): Payer: BLUE CROSS/BLUE SHIELD

## 2018-10-17 ENCOUNTER — Other Ambulatory Visit: Payer: Self-pay

## 2018-10-17 ENCOUNTER — Emergency Department (HOSPITAL_COMMUNITY)
Admission: EM | Admit: 2018-10-17 | Discharge: 2018-10-17 | Disposition: A | Discharge status: Home / Self Care | Payer: BLUE CROSS/BLUE SHIELD | Attending: Emergency Medicine | Admitting: Emergency Medicine

## 2018-10-17 DIAGNOSIS — R1011 Right upper quadrant pain: Secondary | ICD-10-CM | POA: Diagnosis present

## 2018-10-17 DIAGNOSIS — M898X1 Other specified disorders of bone, shoulder: Secondary | ICD-10-CM

## 2018-10-17 DIAGNOSIS — M25512 Pain in left shoulder: Secondary | ICD-10-CM | POA: Diagnosis not present

## 2018-10-17 DIAGNOSIS — F17290 Nicotine dependence, other tobacco product, uncomplicated: Secondary | ICD-10-CM | POA: Insufficient documentation

## 2018-10-17 DIAGNOSIS — J45909 Unspecified asthma, uncomplicated: Secondary | ICD-10-CM | POA: Insufficient documentation

## 2018-10-17 LAB — COMPREHENSIVE METABOLIC PANEL
ALT: 15 U/L (ref 0–44)
AST: 24 U/L (ref 15–41)
Albumin: 4.2 g/dL (ref 3.5–5.0)
Alkaline Phosphatase: 60 U/L (ref 38–126)
Anion gap: 7 (ref 5–15)
BUN: 13 mg/dL (ref 6–20)
CO2: 24 mmol/L (ref 22–32)
Calcium: 9.3 mg/dL (ref 8.9–10.3)
Chloride: 106 mmol/L (ref 98–111)
Creatinine, Ser: 1.12 mg/dL (ref 0.61–1.24)
GFR calc Af Amer: >60 mL/min (ref >60)
GFR calc non Af Amer: >60 mL/min (ref >60)
Glucose, Bld: 89 mg/dL (ref 70–99)
Potassium: 4.1 mmol/L (ref 3.5–5.1)
Sodium: 137 mmol/L (ref 135–145)
Total Bilirubin: 0.7 mg/dL (ref 0.3–1.2)
Total Protein: 7.1 g/dL (ref 6.5–8.1)

## 2018-10-17 LAB — CBC
HCT: 46.8 % (ref 39.0–52.0)
Hemoglobin: 14.9 g/dL (ref 13.0–17.0)
MCH: 26.9 pg (ref 26.0–34.0)
MCHC: 31.8 g/dL (ref 30.0–36.0)
MCV: 84.6 fL (ref 80.0–100.0)
Platelets: 284 10*3/uL (ref 150–400)
RBC: 5.53 MIL/uL (ref 4.22–5.81)
RDW: 12.4 % (ref 11.5–15.5)
WBC: 6.3 10*3/uL (ref 4.0–10.5)
nRBC: 0 % (ref 0.0–0.2)

## 2018-10-17 LAB — URINALYSIS, ROUTINE W REFLEX MICROSCOPIC
Bilirubin Urine: NEGATIVE
Glucose, UA: NEGATIVE mg/dL
Hgb urine dipstick: NEGATIVE
Ketones, ur: NEGATIVE mg/dL
Leukocytes,Ua: NEGATIVE
Nitrite: NEGATIVE
Protein, ur: NEGATIVE mg/dL
Specific Gravity, Urine: 1.028 (ref 1.005–1.030)
pH: 7 (ref 5.0–8.0)

## 2018-10-17 LAB — D-DIMER, QUANTITATIVE: D-Dimer, Quant: <0.27 ug/mL-FEU (ref 0.00–0.50)

## 2018-10-17 LAB — LIPASE, BLOOD: Lipase: 23 U/L (ref 11–51)

## 2018-10-17 MED ORDER — SODIUM CHLORIDE 0.9% FLUSH: 3.0000 mL | Freq: Once | INTRAVENOUS | Status: DC

## 2018-10-17 NOTE — ED Provider Notes (Signed)
[facility] EMERGENCY DEPARTMENT Provider Note   CSN: [id number] Arrival date & time: 10/17/18  1516  History   Chief Complaint Chief Complaint  Patient presents with  . Abdominal Pain   HPI Todd Barajas is a 23 y.o. male with no significant past medical history who presents for evaluation of abdominal pain.  Patient states he has had right upper quadrant abdominal pain x2 week.  Not associate with food intake.  He is not into the nausea and vomiting.  Patient states he has been recently trying to cut back from fried and greasy food however his pain has not resolved.  He rates his current pain a 2/10.  Patient denies radiation of pain.  Patient states pain is worse when he presses on his right upper abdomen.  Denies fever, chills, nausea, vomiting, chest pain, shortness of breath, NSAID use, alcohol use, dysuria, diarrhea constipation.  She states she also has left scapular pain which is been present x4 days.  Pain worse with movement and deep breathing.  He denies any chest pain, hemoptysis, history of PE or DVT.  Denies lower extremity edema, erythema, ecchymosis or warmth.  Denies injury or trauma.  No rashes or lesions.  Currently does not have any other scapular pain right now.  He states he does frequently lift weights and heavy objects however he does not recall lifting anything extremely heavy at time of his pain.  Symptoms intermittent in nature since onset.  Denies additional aggravating or alleviating factors.  Tolerating p.o. intake at home without difficulty. No hematemesis, melena, hematochezia.  No diaphoresis, pleuritic, exertional chest pain.  History obtained from patient.  No interpreter was used.      HPI  Past Medical History:  Diagnosis Date  . Asthma     There are no active problems to display for this patient.   No past surgical history on file.      Home Medications    Prior to Admission medications   Medication Sig Start Date End Date  Taking? Authorizing Provider  hydrOXYzine (ATARAX/VISTARIL) 25 MG tablet Take 1 tablet (25 mg total) by mouth every 8 (eight) hours as needed for anxiety or nausea. 05/10/18   Domenic Moras, PA-C    Family History No family history on file.  Social History Social History   Tobacco Use  . Smoking status: Current Every Day Smoker    Types: Cigars  . Smokeless tobacco: Never Used  Substance Use Topics  . Alcohol use: No  . Drug use: Yes    Types: Marijuana     Allergies   Patient has no known allergies.   Review of Systems Review of Systems  Constitutional: Negative.   HENT: Negative.   Respiratory: Negative.   Cardiovascular: Negative.   Gastrointestinal: Positive for abdominal pain. Negative for abdominal distention, anal bleeding, blood in stool, constipation, diarrhea, nausea, rectal pain and vomiting.  Genitourinary: Negative.   Musculoskeletal:       Left scapula pain.  Skin: Negative.   Neurological: Negative.   All other systems reviewed and are negative.    Physical Exam Updated Vital Signs BP 130/75   Pulse (!) 59   Temp 98.5 F (36.9 C) (Oral)   Resp 18   SpO2 97%   Physical Exam Vitals signs and nursing note reviewed.  Constitutional:      General: He is not in acute distress.    Appearance: He is not ill-appearing, toxic-appearing or diaphoretic.  HENT:     Head:  Normocephalic and atraumatic.     Jaw: There is normal jaw occlusion.     Nose:     Comments: Clear rhinorrhea and congestion to bilateral nares.  No sinus tenderness.    Mouth/Throat:     Mouth: Mucous membranes are moist.     Pharynx: Oropharynx is clear.     Comments: Posterior oropharynx clear.  Mucous membranes moist.  Tonsils without erythema or exudate.  Uvula midline without deviation.  No evidence of PTA or RPA.  No drooling, dysphasia or trismus.  Phonation normal. Neck:     Trachea: Trachea and phonation normal.     Meningeal: Brudzinski's sign and Kernig's sign absent.      Comments: No Neck stiffness or neck rigidity.  No meningismus.  No cervical lymphadenopathy. Cardiovascular:     Rate and Rhythm: Normal rate.     Heart sounds: Normal heart sounds. No murmur. No friction rub. No gallop.      Comments: No murmurs rubs or gallops. Pulmonary:     Comments: Clear to auscultation bilaterally without wheeze, rhonchi or rales.  No accessory muscle usage.  Able speak in full sentences. Abdominal:     General: Bowel sounds are normal.     Palpations: Abdomen is soft.     Tenderness: There is no abdominal tenderness. There is no right CVA tenderness, left CVA tenderness, guarding or rebound. Negative signs include Murphy's sign.     Hernia: No hernia is present.     Comments: Soft, nontender without rebound or guarding.  No CVA tenderness.  Negative Murphy sign.  Musculoskeletal:     Cervical back: Normal.     Thoracic back: Normal.     Lumbar back: Normal.       Back:     Comments: Moves all 4 extremities without difficulty.  Lower extremities without edema, erythema or warmth.  Tenderness palpation to left scapula.  Pain reproducible to palpation.  Skin:    Comments: Brisk capillary refill.  No rashes or lesions.  Neurological:     Mental Status: He is alert.     Comments: Ambulatory in department without difficulty.  Cranial nerves II through XII grossly intact.  No facial droop.  No aphasia.      ED Treatments / Results  Labs (all labs ordered are listed, but only abnormal results are displayed) Labs Reviewed  LIPASE, BLOOD  COMPREHENSIVE METABOLIC PANEL  CBC  URINALYSIS, ROUTINE W REFLEX MICROSCOPIC  D-DIMER, QUANTITATIVE (NOT [facility])    EKG None  Radiology Dg Chest 2 View  Result Date: 10/17/2018 CLINICAL DATA:  Chest pain.  Shortness of breath. EXAM: CHEST - 2 VIEW COMPARISON:  03/15/2017 FINDINGS: Lungs are clear. Heart and mediastinum are within normal limits. Trachea is midline. Bone structures are unremarkable. No large pleural  effusions. IMPRESSION: No active cardiopulmonary disease. Electronically Signed   By: Richarda OverlieAdam  Henn M.D.   On: 10/17/2018 20:56   [place] Abdomen Limited Ruq  Result Date: 10/17/2018 CLINICAL DATA:  Right upper quadrant abdominal pain EXAM: ULTRASOUND ABDOMEN LIMITED RIGHT UPPER QUADRANT COMPARISON:  None. FINDINGS: Gallbladder: No gallstones or wall thickening visualized. No sonographic Murphy sign noted by sonographer. Common bile duct: Diameter: 0.3 cm Liver: No focal lesion identified. Within normal limits in parenchymal echogenicity. Portal vein is patent on color Doppler imaging with normal direction of blood flow towards the liver. IMPRESSION: Normal study Electronically Signed   By: Katherine Mantlehristopher  Green M.D.   On: 10/17/2018 19:38    Procedures Procedures (including critical  care time)  Medications Ordered in ED Medications  sodium chloride flush (NS) 0.9 % injection 3 mL (has no administration in time range)     Initial Impression / Assessment and Plan / ED Course  I have reviewed the triage vital signs and the nursing notes.  Pertinent labs & imaging results that were available during my care of the patient were reviewed by me and considered in my medical decision making (see chart for details).  23 year old male appears otherwise well presents for evaluation of right upper quadrant abdominal pain.  Afebrile, nonseptic, non-ill-appearing.  Pain not worse with food intake.  Abdomen soft, nontender without rebound or guarding.  Negative Murphy sign.  Negative CVA tap bilaterally.  Low suspicion for pyelonephritis or nephrolithiasis.  Heart and lungs clear.  Patient also with left scapular pain.  Pain reproducible to palpation.  No overlying skin changes.  D-dimer negative.  Pain not pleuritic in nature.  Denies any chest pain, shortness of breath, diaphoresis.  He has no tachycardia, tachypnea or hypoxia.  I have low suspicion for PE as cause of his scapula pain.  He has full range of motion to  his left shoulder without difficulty.  He has negative cervical and thoracic lumbar tenderness.  X-ray without evidence of infiltrates, PE, cardiomegaly, pneumothorax.  Likely musculoskeletal pain as cause of his scapular pain.  Have low suspicion for atypical ACS, PE, dissection as cause of his scapula pain.   Labs personally reviewed.   CBC without leukocytosis, metabolic panel without electrolyte, renal or liver abnormality, urinalysis negative, lipase 23, d-dimer negative.  Ultrasound negative for cholecystitis or cholelithiasis.  Reevaluation abdomen soft, nontender without rebound or guarding.  He is tolerating p.o. intake in ED without difficulty.  Ambulatory in ED.  He has no current scapular pain.  Patient is nontoxic, nonseptic appearing, in no apparent distress.  Patient's pain and other symptoms adequately managed in emergency department.  Fluid bolus given.  Labs, imaging and vitals reviewed.  Patient does not meet the SIRS or Sepsis criteria.  On repeat exam patient does not have a surgical abdomin and there are no peritoneal signs.  No indication of appendicitis, bowel obstruction, bowel perforation, cholecystitis, diverticulitis.  Patient discharged home with symptomatic treatment and given strict instructions for follow-up with their primary care physician.  I have also discussed reasons to return immediately to the ER.   The patient has been appropriately medically screened and/or stabilized in the ED. I have low suspicion for any other emergent medical condition which would require further screening, evaluation or treatment in the ED or require inpatient management.  Patient is hemodynamically stable and in no acute distress.  Patient able to ambulate in department prior to ED.  Evaluation does not show acute pathology that would require ongoing or additional emergent interventions while in the emergency department or further inpatient treatment.  I have discussed the diagnosis with the  patient and answered all questions.  Pain is been managed while in the emergency department and patient has no further complaints prior to discharge.  Patient is comfortable with plan discussed in room and is stable for discharge at this time.  I have discussed strict return precautions for returning to the emergency department.  Patient was encouraged to follow-up with PCP/specialist refer to at discharge.      Final Clinical Impressions(s) / ED Diagnoses   Final diagnoses:  RUQ pain  Pain of left scapula    ED Discharge Orders    None  Rajeev Escue A, PA-C 10/18/18 0016    Sabas SousBero, Michael M, MD 10/24/18 [phone]

## 2018-10-17 NOTE — Discharge Instructions (Signed)
Follow up with PCP for reevlaution.  Return to the ED for any new or worsening symptoms.

## 2018-10-17 NOTE — ED Notes (Signed)
Patient verbalized understanding of dc instructions, vss, ambulatory with nad.   

## 2018-10-17 NOTE — ED Notes (Signed)
Patient transported to Ultrasound 

## 2018-10-17 NOTE — ED Triage Notes (Signed)
Pt here from home with c/o right side abd pain , no n/v , pt has tried to refrain from fried foods and spicy foods , pt also has c/o some redness in his right eye

## 2019-01-02 ENCOUNTER — Emergency Department (HOSPITAL_COMMUNITY)
Admission: EM | Admit: 2019-01-02 | Discharge: 2019-01-02 | Discharge status: Against Medical Advice | Payer: BLUE CROSS/BLUE SHIELD

## 2019-01-02 NOTE — ED Notes (Signed)
Called for triage x3

## 2019-01-02 NOTE — ED Notes (Signed)
Called for pt no answer, x5

## 2019-01-02 NOTE — ED Notes (Signed)
Pt's name called for triage no answer x1 

## 2019-01-03 ENCOUNTER — Other Ambulatory Visit: Payer: Self-pay

## 2019-01-03 ENCOUNTER — Emergency Department (HOSPITAL_COMMUNITY)
Admission: EM | Admit: 2019-01-03 | Discharge: 2019-01-03 | Disposition: A | Discharge status: Home / Self Care | Payer: BLUE CROSS/BLUE SHIELD | Attending: Emergency Medicine | Admitting: Emergency Medicine

## 2019-01-03 DIAGNOSIS — M546 Pain in thoracic spine: Secondary | ICD-10-CM | POA: Diagnosis not present

## 2019-01-03 DIAGNOSIS — F17290 Nicotine dependence, other tobacco product, uncomplicated: Secondary | ICD-10-CM | POA: Insufficient documentation

## 2019-01-03 DIAGNOSIS — J45909 Unspecified asthma, uncomplicated: Secondary | ICD-10-CM | POA: Diagnosis not present

## 2019-01-03 MED ORDER — NAPROXEN 500 MG PO TABS: 500.0000 mg | ORAL_TABLET | Freq: Two times a day (BID) | ORAL | 0 refills | Status: DC

## 2019-01-03 NOTE — ED Provider Notes (Signed)
[facility] Provider [facility]   CSN: [id number] Arrival date & time: 01/03/19  1031     History   Chief Complaint Chief Complaint  Patient presents with  . Flank Pain    HPI Todd Barajas is a 23 y.o. male.     Patient presents to the emergency department with complaint of right middle back pain starting about 6 days ago.  Pain is worse with certain positions and movements.  It is intermittent.  No anterior abdominal pain.  No radiation of pain to the legs.  No weakness or difficulty walking.  Pain is not brought on by food.  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  He has not taken any medications for this.  Patient was concerned about this being his kidneys but has had no dysuria, hematuria, or other urinary symptoms.  No history of kidney stones and this is not consistent with a kidney stone.  Onset of symptoms acute.  Nothing makes symptoms better.     Past Medical History:  Diagnosis Date  . Asthma     There are no active problems to display for this patient.   No past surgical history on file.      Home Medications    Prior to Admission medications   Medication Sig Start Date End Date Taking? Authorizing Provider  hydrOXYzine (ATARAX/VISTARIL) 25 MG tablet Take 1 tablet (25 mg total) by mouth every 8 (eight) hours as needed for anxiety or nausea. 05/10/18   Fayrene Helperran, [place], PA-C  naproxen (NAPROSYN) 500 MG tablet Take 1 tablet (500 mg total) by mouth 2 (two) times daily. 01/03/19   Renne CriglerGeiple, Lera Gaines, PA-C    Family History No family history on file.  Social History Social History   Tobacco Use  . Smoking status: Current Every Day Smoker    Types: Cigars  . Smokeless tobacco: Never Used  Substance Use Topics  . Alcohol use: No  . Drug use: Yes    Types: Marijuana     Allergies   Patient has no known allergies.   Review of Systems Review of Systems   Constitutional: Negative for fever and unexpected weight change.  Gastrointestinal: Negative for constipation.       Neg for fecal incontinence  Genitourinary: Negative for difficulty urinating, flank pain and hematuria.       Negative for urinary incontinence or retention  Musculoskeletal: Positive for back pain.  Neurological: Negative for weakness and numbness.       Negative for saddle paresthesias      Physical Exam Updated Vital Signs BP 130/76 (BP Location: Right Arm)   Pulse (!) 56   Temp 98.4 F (36.9 C) (Oral)   Resp 15   SpO2 98%   Physical Exam Vitals signs and nursing note reviewed.  Constitutional:      Appearance: He is well-developed.  HENT:     Head: Normocephalic and atraumatic.  Eyes:     Conjunctiva/sclera: Conjunctivae normal.  Neck:     Musculoskeletal: Normal range of motion.  Abdominal:     Palpations: Abdomen is soft.     Tenderness: There is no abdominal tenderness.  Musculoskeletal: Normal range of motion.     Cervical back: He exhibits normal range of motion, no tenderness and no bony tenderness.     Thoracic back: He exhibits tenderness. He exhibits normal range of motion and no bony tenderness.     Lumbar  back: He exhibits normal range of motion, no tenderness and no bony tenderness.     Comments: No step-off noted with palpation of spine.   Skin:    General: Skin is warm and dry.  Neurological:     Mental Status: He is alert.     Sensory: No sensory deficit.     Motor: No abnormal muscle tone.     Deep Tendon Reflexes: Reflexes are normal and symmetric.     Comments: 5/5 strength in entire lower extremities bilaterally. No sensation deficit.        ED Treatments / Results  Labs (all labs ordered are listed, but only abnormal results are displayed) Labs Reviewed  URINALYSIS, ROUTINE W REFLEX MICROSCOPIC    EKG None  Radiology No results found.  Procedures Procedures (including critical care time)  Medications Ordered in  ED Medications - No data to display   Initial Impression / Assessment and Plan / ED Course  I have reviewed the triage vital signs and the nursing notes.  Pertinent labs & imaging results that were available during my care of the patient were reviewed by me and considered in my medical decision making (see chart for details).        12:01 PM Patient seen and examined.   Vital signs reviewed and are as follows: Vitals:   01/03/19 1039  BP: 130/76  Pulse: (!) 56  Resp: 15  Temp: 98.4 F (36.9 C)  SpO2: 98%    No red flag s/s of back pain.  Positional, musculoskeletal in nature.  Patient was counseled on back pain precautions and told to do activity as tolerated but do not lift, push, or pull heavy objects more than 10 pounds for the next week.  Patient counseled to use ice or heat on back for no longer than 15 minutes every hour.   Patient urged to follow-up with PCP if pain does not improve with treatment and rest or if pain becomes recurrent. Urged to return with worsening severe pain, loss of bowel or bladder control, trouble walking.   The patient verbalizes understanding and agrees with the plan.   Final Clinical Impressions(s) / ED Diagnoses   Final diagnoses:  Acute right-sided thoracic back pain   Patient with back pain, positional in nature. No abd pain. No neurological deficits. Patient is ambulatory. No warning symptoms of back pain including: fecal incontinence, urinary retention or overflow incontinence, night sweats, waking from sleep with back pain, unexplained fevers or weight loss, h/o cancer, IVDU, recent trauma. No concern for cauda equina, epidural abscess, or other serious cause of back pain. Conservative measures such as rest, ice/heat and pain medicine indicated with PCP follow-up if no improvement with conservative management.    ED Discharge Orders         Ordered    naproxen (NAPROSYN) 500 MG tablet  2 times daily     01/03/19 1155            Carlisle Cater, [place] 01/03/19 1202    Lucrezia Starch, MD 01/04/19 [phone]

## 2019-01-03 NOTE — Discharge Instructions (Signed)
Please read and follow all provided instructions.  Your diagnoses today include:  1. Acute right-sided thoracic back pain     Tests performed today include:  Vital signs - see below for your results today  Medications prescribed:   Naproxen - anti-inflammatory pain medication  Do not exceed 500mg  naproxen every 12 hours, take with food  You have been prescribed an anti-inflammatory medication or NSAID. Take with food. Take smallest effective dose for the shortest duration needed for your pain. Stop taking if you experience stomach pain or vomiting.   Take any prescribed medications only as directed.  Home care instructions:   Follow any educational materials contained in this packet  Please rest, use ice or heat on your back for the next several days  Do not lift, push, pull anything more than 10 pounds for the next week  Follow-up instructions: Please follow-up with your primary care provider in the next 1 week for further evaluation of your symptoms.   Return instructions:  SEEK IMMEDIATE MEDICAL ATTENTION IF YOU HAVE:  New numbness, tingling, weakness, or problem with the use of your arms or legs  Severe back pain not relieved with medications  Loss control of your bowels or bladder  Increasing pain in any areas of the body (such as chest or abdominal pain)  Shortness of breath, dizziness, or fainting.   Worsening nausea (feeling sick to your stomach), vomiting, fever, or sweats  Any other emergent concerns regarding your health   Additional Information:  Your vital signs today were: BP 130/76 (BP Location: Right Arm)    Pulse (!) 56    Temp 98.4 F (36.9 C) (Oral)    Resp 15    SpO2 98%  If your blood pressure (BP) was elevated above 135/85 this visit, please have this repeated by your doctor within one month. --------------

## 2019-01-03 NOTE — ED Triage Notes (Signed)
Patient reports R flank pain since Friday, improves with lying down but is not worse with any activity or movement. Denies known injury. No urinary symptoms.

## 2019-01-16 ENCOUNTER — Encounter (HOSPITAL_COMMUNITY): Payer: Self-pay

## 2019-01-16 ENCOUNTER — Emergency Department (HOSPITAL_COMMUNITY): Payer: BLUE CROSS/BLUE SHIELD

## 2019-01-16 ENCOUNTER — Emergency Department (HOSPITAL_COMMUNITY)
Admission: EM | Admit: 2019-01-16 | Discharge: 2019-01-16 | Disposition: A | Discharge status: Home / Self Care | Payer: BLUE CROSS/BLUE SHIELD | Attending: Emergency Medicine | Admitting: Emergency Medicine

## 2019-01-16 ENCOUNTER — Other Ambulatory Visit: Payer: Self-pay

## 2019-01-16 DIAGNOSIS — M549 Dorsalgia, unspecified: Secondary | ICD-10-CM

## 2019-01-16 DIAGNOSIS — J45909 Unspecified asthma, uncomplicated: Secondary | ICD-10-CM | POA: Diagnosis not present

## 2019-01-16 DIAGNOSIS — M545 Low back pain: Secondary | ICD-10-CM | POA: Insufficient documentation

## 2019-01-16 DIAGNOSIS — R109 Unspecified abdominal pain: Secondary | ICD-10-CM | POA: Diagnosis present

## 2019-01-16 DIAGNOSIS — F17290 Nicotine dependence, other tobacco product, uncomplicated: Secondary | ICD-10-CM | POA: Diagnosis not present

## 2019-01-16 LAB — URINALYSIS, ROUTINE W REFLEX MICROSCOPIC
Bilirubin Urine: NEGATIVE
Glucose, UA: NEGATIVE mg/dL
Hgb urine dipstick: NEGATIVE
Ketones, ur: 20 mg/dL — AB
Leukocytes,Ua: NEGATIVE
Nitrite: NEGATIVE
Protein, ur: NEGATIVE mg/dL
Specific Gravity, Urine: 1.031 — ABNORMAL HIGH (ref 1.005–1.030)
pH: 6 (ref 5.0–8.0)

## 2019-01-16 LAB — COMPREHENSIVE METABOLIC PANEL
ALT: 20 U/L (ref 0–44)
AST: 33 U/L (ref 15–41)
Albumin: 4.3 g/dL (ref 3.5–5.0)
Alkaline Phosphatase: 56 U/L (ref 38–126)
Anion gap: 11 (ref 5–15)
BUN: 15 mg/dL (ref 6–20)
CO2: 22 mmol/L (ref 22–32)
Calcium: 9.3 mg/dL (ref 8.9–10.3)
Chloride: 104 mmol/L (ref 98–111)
Creatinine, Ser: 1.18 mg/dL (ref 0.61–1.24)
GFR calc Af Amer: >60 mL/min (ref >60)
GFR calc non Af Amer: >60 mL/min (ref >60)
Glucose, Bld: 87 mg/dL (ref 70–99)
Potassium: 4 mmol/L (ref 3.5–5.1)
Sodium: 137 mmol/L (ref 135–145)
Total Bilirubin: 1.6 mg/dL — ABNORMAL HIGH (ref 0.3–1.2)
Total Protein: 7.4 g/dL (ref 6.5–8.1)

## 2019-01-16 LAB — CBC WITH DIFFERENTIAL/PLATELET
Abs Immature Granulocytes: 0.01 10*3/uL (ref 0.00–0.07)
Basophils Absolute: 0 10*3/uL (ref 0.0–0.1)
Basophils Relative: 1 %
Eosinophils Absolute: 0.1 10*3/uL (ref 0.0–0.5)
Eosinophils Relative: 2 %
HCT: 48.3 % (ref 39.0–52.0)
Hemoglobin: 15.7 g/dL (ref 13.0–17.0)
Immature Granulocytes: 0 %
Lymphocytes Relative: 28 %
Lymphs Abs: 1.2 10*3/uL (ref 0.7–4.0)
MCH: 27.3 pg (ref 26.0–34.0)
MCHC: 32.5 g/dL (ref 30.0–36.0)
MCV: 83.9 fL (ref 80.0–100.0)
Monocytes Absolute: 0.5 10*3/uL (ref 0.1–1.0)
Monocytes Relative: 10 %
Neutro Abs: 2.5 10*3/uL (ref 1.7–7.7)
Neutrophils Relative %: 59 %
Platelets: 260 10*3/uL (ref 150–400)
RBC: 5.76 MIL/uL (ref 4.22–5.81)
RDW: 12.9 % (ref 11.5–15.5)
WBC: 4.3 10*3/uL (ref 4.0–10.5)
nRBC: 0 % (ref 0.0–0.2)

## 2019-01-16 LAB — LIPASE, BLOOD: Lipase: 20 U/L (ref 11–51)

## 2019-01-16 MED ORDER — CYCLOBENZAPRINE HCL 5 MG PO TABS: 5.0000 mg | ORAL_TABLET | Freq: Two times a day (BID) | ORAL | 0 refills | Status: DC | PRN

## 2019-01-16 MED ORDER — KETOROLAC TROMETHAMINE 30 MG/ML IJ SOLN
30.0000 mg | Freq: Once | INTRAMUSCULAR | Status: AC
  Administered 2019-01-16: 15:00:00 30 mg via INTRAMUSCULAR
  Filled 2019-01-16: qty 1

## 2019-01-16 NOTE — ED Provider Notes (Signed)
[facility] EMERGENCY DEPARTMENT Provider Note   CSN: [id number] Arrival date & time: 01/16/19  1104     History   Chief Complaint No chief complaint on file.   HPI Todd Barajas is a 23 y.o. male.     HPI    23 year old male presents today with complaints of right-sided flank pain.  Patient notes symptoms started approximately 2 weeks ago.  He notes the pain radiates into his right flank, sitting up makes symptoms worse laying down makes them better.  He notes some associated nausea.  He denies any lower abdominal tenderness urinary symptoms bowel or bladder changes.  He denies any history of the same.  Patient has been previously evaluated this and diagnosed with muscular back pain.    Past Medical History:  Diagnosis Date  . Asthma     There are no active problems to display for this patient.   History reviewed. No pertinent surgical history.      Home Medications    Prior to Admission medications   Medication Sig Start Date End Date Taking? Authorizing Provider  cyclobenzaprine (FLEXERIL) 5 MG tablet Take 1 tablet (5 mg total) by mouth 2 (two) times daily as needed for muscle spasms. 01/16/19   Ambriel Gorelick, Tinnie Gens, PA-C  hydrOXYzine (ATARAX/VISTARIL) 25 MG tablet Take 1 tablet (25 mg total) by mouth every 8 (eight) hours as needed for anxiety or nausea. 05/10/18   Fayrene Helper, PA-C  naproxen (NAPROSYN) 500 MG tablet Take 1 tablet (500 mg total) by mouth 2 (two) times daily. 01/03/19   Renne Crigler, PA-C    Family History No family history on file.  Social History Social History   Tobacco Use  . Smoking status: Current Every Day Smoker    Types: Cigars  . Smokeless tobacco: Never Used  Substance Use Topics  . Alcohol use: No  . Drug use: Yes    Types: Marijuana     Allergies   Patient has no known allergies.   Review of Systems Review of Systems  All other systems reviewed and are negative.   Physical Exam Updated Vital Signs  BP 132/82   Pulse 81   Temp (!) 97.4 F (36.3 C) (Oral)   Resp 14   SpO2 100%   Physical Exam Vitals signs and nursing note reviewed.  Constitutional:      Appearance: He is well-developed.  HENT:     Head: Normocephalic and atraumatic.  Eyes:     General: No scleral icterus.       Right eye: No discharge.        Left eye: No discharge.     Conjunctiva/sclera: Conjunctivae normal.     Pupils: Pupils are equal, round, and reactive to light.  Neck:     Musculoskeletal: Normal range of motion.     Vascular: No JVD.     Trachea: No tracheal deviation.  Pulmonary:     Effort: Pulmonary effort is normal.     Breath sounds: No stridor.  Musculoskeletal:     Comments: No midline back TTP, TTP to right lateral lower thoracic musculature   Neurological:     Mental Status: He is alert and oriented to person, place, and time.     Coordination: Coordination normal.  Psychiatric:        Behavior: Behavior normal.        Thought Content: Thought content normal.        Judgment: Judgment normal.     ED Treatments /  Results  Labs (all labs ordered are listed, but only abnormal results are displayed) Labs Reviewed  COMPREHENSIVE METABOLIC PANEL - Abnormal; Notable for the following components:      Result Value   Total Bilirubin 1.6 (*)    All other components within normal limits  URINALYSIS, ROUTINE W REFLEX MICROSCOPIC - Abnormal; Notable for the following components:   Specific Gravity, Urine 1.031 (*)    Ketones, ur 20 (*)    All other components within normal limits  CBC WITH DIFFERENTIAL/PLATELET  LIPASE, BLOOD    EKG None  Radiology No results found.  Procedures Procedures (including critical care time)  Medications Ordered in ED Medications  ketorolac (TORADOL) 30 MG/ML injection 30 mg (30 mg Intramuscular Given 01/16/19 1503)     Initial Impression / Assessment and Plan / ED Course  I have reviewed the triage vital signs and the nursing notes.   Pertinent labs & imaging results that were available during my care of the patient were reviewed by me and considered in my medical decision making (see chart for details).          Assessment/Plan: 23 year old male presents today with likely uncomplicated muscular back pain.  He has very reassuring work-up with no signs of acute intrathoracic intra-abdominal pathology.  Patient will be treated symptomatically with referral as an outpatient.  Return precautions given.  Verbalized understanding and agreement to today's plan   Final Clinical Impressions(s) / ED Diagnoses   Final diagnoses:  Acute right-sided back pain, unspecified back location    ED Discharge Orders         Ordered    cyclobenzaprine (FLEXERIL) 5 MG tablet  2 times daily PRN     01/16/19 1513           Okey Regal, PA-C 01/21/19 1425    Carmin Muskrat, MD 01/21/19 1507

## 2019-01-16 NOTE — ED Notes (Signed)
Patient verbalizes understanding of discharge instructions. Opportunity for questioning and answers were provided. Armband removed by staff, pt discharged from ED.  

## 2019-01-16 NOTE — ED Triage Notes (Signed)
Patient complains of right flank pain x 2 weeks, states pain worse when standing. Denies dysuria, states urine is clear, denies trauma

## 2019-01-16 NOTE — ED Notes (Signed)
Patient transported to CT 

## 2019-01-16 NOTE — Discharge Instructions (Signed)
Please read attached information. If you experience any new or worsening signs or symptoms please return to the emergency room for evaluation. Please follow-up with your primary care provider or specialist as discussed. Please use medication prescribed only as directed and discontinue taking if you have any concerning signs or symptoms.   °

## 2019-01-16 NOTE — ED Notes (Signed)
Pt reminded of the need for urine, supplies have been provided.

## 2019-04-22 ENCOUNTER — Emergency Department (HOSPITAL_COMMUNITY): Payer: BLUE CROSS/BLUE SHIELD

## 2019-04-22 ENCOUNTER — Other Ambulatory Visit: Payer: Self-pay

## 2019-04-22 ENCOUNTER — Emergency Department (HOSPITAL_COMMUNITY)
Admission: EM | Admit: 2019-04-22 | Discharge: 2019-04-22 | Disposition: A | Discharge status: Home / Self Care | Payer: BLUE CROSS/BLUE SHIELD | Attending: Emergency Medicine | Admitting: Emergency Medicine

## 2019-04-22 DIAGNOSIS — Z791 Long term (current) use of non-steroidal anti-inflammatories (NSAID): Secondary | ICD-10-CM | POA: Insufficient documentation

## 2019-04-22 DIAGNOSIS — F17290 Nicotine dependence, other tobacco product, uncomplicated: Secondary | ICD-10-CM | POA: Insufficient documentation

## 2019-04-22 DIAGNOSIS — J45909 Unspecified asthma, uncomplicated: Secondary | ICD-10-CM | POA: Diagnosis not present

## 2019-04-22 DIAGNOSIS — R0602 Shortness of breath: Secondary | ICD-10-CM | POA: Insufficient documentation

## 2019-04-22 DIAGNOSIS — R0789 Other chest pain: Secondary | ICD-10-CM | POA: Insufficient documentation

## 2019-04-22 DIAGNOSIS — Z20828 Contact with and (suspected) exposure to other viral communicable diseases: Secondary | ICD-10-CM | POA: Insufficient documentation

## 2019-04-22 DIAGNOSIS — Z20822 Contact with and (suspected) exposure to covid-19: Secondary | ICD-10-CM

## 2019-04-22 LAB — CBG MONITORING, ED: Glucose-Capillary: 84 mg/dL (ref 70–99)

## 2019-04-22 MED ORDER — AEROCHAMBER PLUS FLO-VU MEDIUM MISC
1.0000 | Freq: Once | Status: AC
  Administered 2019-04-22: 1
  Filled 2019-04-22: qty 1

## 2019-04-22 MED ORDER — ALBUTEROL SULFATE HFA 108 (90 BASE) MCG/ACT IN AERS
2.0000 | INHALATION_SPRAY | RESPIRATORY_TRACT | Status: DC | PRN
  Administered 2019-04-22: 05:00:00 2 via RESPIRATORY_TRACT
  Filled 2019-04-22: qty 6.7

## 2019-04-22 NOTE — ED Provider Notes (Signed)
[facility] DEPT Provider Note   CSN: [id number] Arrival date & time: 04/22/19  9509     History Chief Complaint  Patient presents with  . Chest Pain  . Shortness of Breath    Todd Barajas is a 23 y.o. male with a hx of childhood asthma presents to the Emergency Department complaining of gradual, intermittent shortness of breath onset approximately 1 week ago.  Patient reports he has some chest tightness and burning in his chest when he takes a deep breath but no pain.  He reports some cough, mild headache, intermittent abdominal cramping, sore throat and generalized fatigue.  He denies known sick contacts or Covid contacts.  He denies fever, chills, dyspnea on exertion, peripheral edema.  Patient denies a history of DVT or PE.  Nothing seems to make the symptoms better.  Patient also reports feeling very thirsty over the last week.  He denies a history of diabetes.   The history is provided by the patient and medical records. No language interpreter was used.       Past Medical History:  Diagnosis Date  . Asthma     There are no problems to display for this patient.   No past surgical history on file.     No family history on file.  Social History   Tobacco Use  . Smoking status: Current Every Day Smoker    Types: Cigars  . Smokeless tobacco: Never Used  Substance Use Topics  . Alcohol use: No  . Drug use: Yes    Types: Marijuana    Home Medications Prior to Admission medications   Medication Sig Start Date End Date Taking? Authorizing Provider  cyclobenzaprine (FLEXERIL) 5 MG tablet Take 1 tablet (5 mg total) by mouth 2 (two) times daily as needed for muscle spasms. 01/16/19   Hedges, Dellis Filbert, PA-C  hydrOXYzine (ATARAX/VISTARIL) 25 MG tablet Take 1 tablet (25 mg total) by mouth every 8 (eight) hours as needed for anxiety or nausea. 05/10/18   Domenic Moras, PA-C  naproxen (NAPROSYN) 500 MG tablet Take 1 tablet (500 mg total) by mouth  2 (two) times daily. 01/03/19   Carlisle Cater, PA-C    Allergies    Patient has no known allergies.  Review of Systems   Review of Systems  Constitutional: Positive for fatigue. Negative for appetite change, diaphoresis, fever and unexpected weight change.  HENT: Negative for mouth sores.   Eyes: Negative for visual disturbance.  Respiratory: Positive for shortness of breath. Negative for cough, chest tightness and wheezing.   Cardiovascular: Positive for chest pain ( burning with breathing).  Gastrointestinal: Negative for abdominal pain, constipation, diarrhea, nausea and vomiting.  Endocrine: Negative for polydipsia, polyphagia and polyuria.  Genitourinary: Negative for dysuria, frequency, hematuria and urgency.  Musculoskeletal: Negative for back pain and neck stiffness.  Skin: Negative for rash.  Allergic/Immunologic: Negative for immunocompromised state.  Neurological: Positive for headaches (mild, generalized). Negative for syncope and light-headedness.  Hematological: Does not bruise/bleed easily.  Psychiatric/Behavioral: Negative for sleep disturbance. The patient is not nervous/anxious.     Physical Exam Updated Vital Signs BP (!) 147/89 (BP Location: Right Arm)   Pulse 68   Temp 98 F (36.7 C) (Oral)   Resp 17   Ht 5\' 11"  (1.803 m)   Wt 99.8 kg   SpO2 99%   BMI 30.68 kg/m   Physical Exam Vitals and nursing note reviewed.  Constitutional:      General: He is not in acute distress.  Appearance: He is not diaphoretic.  HENT:     Head: Normocephalic.  Eyes:     General: No scleral icterus.    Conjunctiva/sclera: Conjunctivae normal.  Cardiovascular:     Rate and Rhythm: Normal rate and regular rhythm.     Pulses: Normal pulses.          Radial pulses are 2+ on the right side and 2+ on the left side.  Pulmonary:     Effort: Pulmonary effort is normal. No tachypnea, accessory muscle usage, prolonged expiration, respiratory distress or retractions.      Breath sounds: Normal breath sounds. No stridor.     Comments: Equal chest rise. No increased work of breathing. Clear and equal breath sounds Abdominal:     General: There is no distension.     Palpations: Abdomen is soft.     Tenderness: There is no abdominal tenderness. There is no guarding or rebound.  Musculoskeletal:     Cervical back: Normal range of motion.     Comments: Moves all extremities equally and without difficulty.  Skin:    General: Skin is warm and dry.     Capillary Refill: Capillary refill takes less than 2 seconds.  Neurological:     Mental Status: He is alert.     GCS: GCS eye subscore is 4. GCS verbal subscore is 5. GCS motor subscore is 6.     Comments: Speech is clear and goal oriented.  Psychiatric:        Mood and Affect: Mood normal.     ED Results / Procedures / Treatments   Labs (all labs ordered are listed, but only abnormal results are displayed) Labs Reviewed  NOVEL CORONAVIRUS, NAA (HOSP ORDER, SEND-OUT TO REF LAB; TAT 18-24 HRS)  CBG MONITORING, ED    EKG EKG Interpretation  Date/Time:  Monday April 22 2019 03:32:16 EST Ventricular Rate:  68 PR Interval:    QRS Duration: 91 QT Interval:  367 QTC Calculation: 391 R Axis:   78 Text Interpretation: Sinus rhythm Borderline T abnormalities, inferior leads Borderline ST elevation, anterior leads No significant change since last tracing Confirmed by Rochele Raring [phone]) on 04/22/2019 4:37:33 AM   Radiology DG Chest Port 1 View  Result Date: 04/22/2019 CLINICAL DATA:  Chest pain and shortness of breath EXAM: PORTABLE CHEST 1 VIEW COMPARISON:  10/17/2018 FINDINGS: Artifact from EKG leads. There is no edema, consolidation, effusion, or pneumothorax. Normal heart size and mediastinal contours. No osseous findings IMPRESSION: Normal portable chest. Electronically Signed   By: Marnee Spring M.D.   On: 04/22/2019 04:35    Procedures Procedures (including critical care time)   Medications Ordered in ED Medications  albuterol (VENTOLIN HFA) 108 (90 Base) MCG/ACT inhaler 2 puff (2 puffs Inhalation Given 04/22/19 0505)  AeroChamber Plus Flo-Vu Medium MISC 1 each (1 each Other Given 04/22/19 0505)    ED Course  I have reviewed the triage vital signs and the nursing notes.  Pertinent labs & imaging results that were available during my care of the patient were reviewed by me and considered in my medical decision making (see chart for details).    MDM Rules/Calculators/A&P                      Patient presents with shortness of breath and other Covid-like symptoms.  Vital signs are within normal limits.  No fever, tachycardia, hypoxia.  No respiratory distress.  Patient does report feeling significantly better after albuterol.  He  has a history of childhood asthma and may have a mild asthma exacerbation however no wheezing today on exam.  Patient tested for Covid.  Patient concerned about diabetes because he felt thirsty however blood sugars within normal limits.  Patient is well-appearing.  Will be discharged home.  Covid test pending.  Patient will need close primary care follow-up.  States understanding.  Final Clinical Impression(s) / ED Diagnoses Final diagnoses:  Shortness of breath  Suspected COVID-19 virus infection    Rx / DC Orders ED Discharge Orders    None       Jaydon Avina, Boyd KerbsHannah, PA-C 04/22/19 0548    Ward, Layla MawKristen N, DO 04/22/19 [phone]

## 2019-04-22 NOTE — ED Triage Notes (Signed)
Patient c/o Chest pain and SOB started 2 weeks ago and got worsen tonight. Denies fever, N/V. No distress at this time. BP 147/89, RR 20 , HR 68, O2sat 100 on RA.

## 2019-04-22 NOTE — Discharge Instructions (Addendum)
1. Medications: Alternate tylenol and ibuprofen for fever control, albuterol for shortness of breath, continue usual home medications 2. Treatment: rest, drink plenty of fluids, isolate for the next 10 days 3. Follow Up: Please followup with your primary doctor if your symptoms are not improving after 10-14 days; Please return to the ER for high fevers, persistent vomiting, shortness of breath or other concerns.

## 2019-04-23 LAB — NOVEL CORONAVIRUS, NAA (HOSP ORDER, SEND-OUT TO REF LAB; TAT 18-24 HRS): SARS-CoV-2, NAA: NOT DETECTED

## 2019-04-26 ENCOUNTER — Other Ambulatory Visit: Payer: Self-pay

## 2019-04-26 ENCOUNTER — Emergency Department (HOSPITAL_COMMUNITY)
Admission: EM | Admit: 2019-04-26 | Discharge: 2019-04-26 | Disposition: A | Discharge status: Home / Self Care | Payer: BLUE CROSS/BLUE SHIELD | Attending: Emergency Medicine | Admitting: Emergency Medicine

## 2019-04-26 ENCOUNTER — Encounter (HOSPITAL_COMMUNITY): Payer: Self-pay | Admitting: Emergency Medicine

## 2019-04-26 ENCOUNTER — Emergency Department (HOSPITAL_COMMUNITY): Payer: BLUE CROSS/BLUE SHIELD

## 2019-04-26 DIAGNOSIS — R0789 Other chest pain: Secondary | ICD-10-CM | POA: Insufficient documentation

## 2019-04-26 DIAGNOSIS — R0981 Nasal congestion: Secondary | ICD-10-CM | POA: Diagnosis not present

## 2019-04-26 DIAGNOSIS — F419 Anxiety disorder, unspecified: Secondary | ICD-10-CM | POA: Diagnosis not present

## 2019-04-26 DIAGNOSIS — J45901 Unspecified asthma with (acute) exacerbation: Secondary | ICD-10-CM | POA: Insufficient documentation

## 2019-04-26 DIAGNOSIS — J029 Acute pharyngitis, unspecified: Secondary | ICD-10-CM | POA: Diagnosis not present

## 2019-04-26 DIAGNOSIS — R1013 Epigastric pain: Secondary | ICD-10-CM | POA: Insufficient documentation

## 2019-04-26 DIAGNOSIS — F17290 Nicotine dependence, other tobacco product, uncomplicated: Secondary | ICD-10-CM | POA: Insufficient documentation

## 2019-04-26 DIAGNOSIS — Z79899 Other long term (current) drug therapy: Secondary | ICD-10-CM | POA: Diagnosis not present

## 2019-04-26 DIAGNOSIS — R0600 Dyspnea, unspecified: Secondary | ICD-10-CM | POA: Insufficient documentation

## 2019-04-26 DIAGNOSIS — J45909 Unspecified asthma, uncomplicated: Secondary | ICD-10-CM | POA: Diagnosis not present

## 2019-04-26 LAB — CBC
HCT: 46.7 % (ref 39.0–52.0)
Hemoglobin: 14.8 g/dL (ref 13.0–17.0)
MCH: 26.6 pg (ref 26.0–34.0)
MCHC: 31.7 g/dL (ref 30.0–36.0)
MCV: 84 fL (ref 80.0–100.0)
Platelets: 300 10*3/uL (ref 150–400)
RBC: 5.56 MIL/uL (ref 4.22–5.81)
RDW: 12.8 % (ref 11.5–15.5)
WBC: 5.7 10*3/uL (ref 4.0–10.5)
nRBC: 0 % (ref 0.0–0.2)

## 2019-04-26 LAB — TROPONIN I (HIGH SENSITIVITY): Troponin I (High Sensitivity): 4 ng/L (ref <18)

## 2019-04-26 LAB — BASIC METABOLIC PANEL
Anion gap: 12 (ref 5–15)
BUN: 16 mg/dL (ref 6–20)
CO2: 23 mmol/L (ref 22–32)
Calcium: 9.3 mg/dL (ref 8.9–10.3)
Chloride: 103 mmol/L (ref 98–111)
Creatinine, Ser: 1 mg/dL (ref 0.61–1.24)
GFR calc Af Amer: >60 mL/min (ref >60)
GFR calc non Af Amer: >60 mL/min (ref >60)
Glucose, Bld: 94 mg/dL (ref 70–99)
Potassium: 3.5 mmol/L (ref 3.5–5.1)
Sodium: 138 mmol/L (ref 135–145)

## 2019-04-26 LAB — D-DIMER, QUANTITATIVE: D-Dimer, Quant: <0.27 ug/mL-FEU (ref 0.00–0.50)

## 2019-04-26 MED ORDER — HYDROXYZINE HCL 10 MG PO TABS: 10.0000 mg | ORAL_TABLET | Freq: Three times a day (TID) | ORAL | 0 refills | Status: DC | PRN

## 2019-04-26 MED ORDER — SODIUM CHLORIDE 0.9% FLUSH: 3.0000 mL | Freq: Once | INTRAVENOUS | Status: DC

## 2019-04-26 MED ORDER — LORAZEPAM 1 MG PO TABS
1.0000 mg | ORAL_TABLET | Freq: Once | ORAL | Status: AC
  Administered 2019-04-26: 11:00:00 1 mg via ORAL
  Filled 2019-04-26: qty 1

## 2019-04-26 NOTE — ED Provider Notes (Signed)
[facility] EMERGENCY DEPARTMENT Provider Note   CSN: [id number] Arrival date & time: 04/26/19  0439     History Chief Complaint  Patient presents with  . Shortness of Breath    Todd Barajas is a 24 y.o. male.  HPI   Pt is a 24 y/o male with a h/o asthma who presents to the ED today for SOB. States that for the last 5 days he has had intermittent episodes of SOB. States these episodes happen randomly and usually happen more at night. Episodes usually last from about 30 min -1.5 hours. He also reports associated left sided chest pain when this occurs. These episodes are not associated with exertion and he denies that the episodes are related to food intake. He seems to have some pain with inspiration when these episodes occur. Denies cough or fevers. He has a mild sore throat and nasal congestion. Denies vomiting, diarrhea. He has some epigastric abd discomfort intermittently as well but it is not necessarily associated with the chest pain. He reports that he feels very anxious currently and feels fidgety.   Denies leg pain/swelling, hemoptysis, recent surgery/trauma, recent long travel, hormone use, personal hx of cancer, or hx of DVT/PE.   He reports a family history of cancer and is concerned that this could be related to underlying cancer.   Past Medical History:  Diagnosis Date  . Asthma     There are no problems to display for this patient.  History reviewed. No pertinent surgical history.    No family history on file.  Social History   Tobacco Use  . Smoking status: Current Every Day Smoker    Types: Cigars  . Smokeless tobacco: Never Used  Substance Use Topics  . Alcohol use: No  . Drug use: Yes    Types: Marijuana    Home Medications Prior to Admission medications   Medication Sig Start Date End Date Taking? Authorizing Provider  cyclobenzaprine (FLEXERIL) 5 MG tablet Take 1 tablet (5 mg total) by mouth 2 (two) times daily as needed for  muscle spasms. 01/16/19   Hedges, Tinnie Gens, PA-C  hydrOXYzine (ATARAX/VISTARIL) 10 MG tablet Take 1 tablet (10 mg total) by mouth every 8 (eight) hours as needed. 04/26/19   Floree Zuniga S, PA-C  naproxen (NAPROSYN) 500 MG tablet Take 1 tablet (500 mg total) by mouth 2 (two) times daily. 01/03/19   Renne Crigler, PA-C    Allergies    Patient has no known allergies.  Review of Systems   Review of Systems  Constitutional: Negative for chills and fever.  HENT: Negative for ear pain and sore throat.   Eyes: Negative for pain and visual disturbance.  Respiratory: Positive for shortness of breath. Negative for cough.   Cardiovascular: Positive for chest pain and palpitations.  Gastrointestinal: Positive for abdominal pain. Negative for constipation, diarrhea, nausea and vomiting.  Genitourinary: Negative for dysuria and hematuria.  Musculoskeletal: Negative for arthralgias and back pain.  Skin: Negative for color change and rash.  Neurological: Negative for seizures and syncope.  All other systems reviewed and are negative.   Physical Exam Updated Vital Signs BP 118/68 (BP Location: Right Arm)   Pulse 60   Temp 97.6 F (36.4 C) (Oral)   Resp 20   SpO2 100%   Physical Exam Vitals and nursing note reviewed.  Constitutional:      Appearance: He is well-developed.  HENT:     Head: Normocephalic and atraumatic.  Eyes:     Conjunctiva/sclera:  Conjunctivae normal.  Cardiovascular:     Rate and Rhythm: Normal rate and regular rhythm.     Heart sounds: Normal heart sounds. No murmur.  Pulmonary:     Effort: Pulmonary effort is normal. No respiratory distress.     Breath sounds: Normal breath sounds. No decreased breath sounds, wheezing, rhonchi or rales.  Abdominal:     Palpations: Abdomen is soft.     Tenderness: There is no abdominal tenderness.  Musculoskeletal:     Cervical back: Neck supple.  Skin:    General: Skin is warm and dry.  Neurological:     Mental Status: He is  alert.  Psychiatric:     Comments: As patient describes symptoms he becomes increasingly anxious and starts to breathe faster and take deeper breaths     ED Results / Procedures / Treatments   Labs (all labs ordered are listed, but only abnormal results are displayed) Labs Reviewed  BASIC METABOLIC PANEL  CBC  D-DIMER, QUANTITATIVE (NOT AT [facility])  TROPONIN I (HIGH SENSITIVITY)  TROPONIN I (HIGH SENSITIVITY)    EKG EKG Interpretation  Date/Time:  Friday April 26 2019 04:44:54 EST Ventricular Rate:  57 PR Interval:  194 QRS Duration: 96 QT Interval:  402 QTC Calculation: 391 R Axis:   83 Text Interpretation: Sinus bradycardia with sinus arrhythmia T wave abnormality, consider inferolateral ischemia Abnormal ECG similar TWI and ST changes to a couple days ago Confirmed by Merrily Pew [phone]) on 04/26/2019 4:51:29 AM   Radiology DG Chest 2 View  Result Date: 04/26/2019 CLINICAL DATA:  Chest pain and shortness of breath. EXAM: CHEST - 2 VIEW COMPARISON:  Radiograph 4 days ago 04/22/2019 FINDINGS: The cardiomediastinal contours are normal. The lungs are clear. Pulmonary vasculature is normal. No consolidation, pleural effusion, or pneumothorax. No acute osseous abnormalities are seen. IMPRESSION: Normal radiographs of the chest. Electronically Signed   By: Keith Rake M.D.   On: 04/26/2019 05:53    Procedures Procedures (including critical care time)  Medications Ordered in ED Medications  sodium chloride flush (NS) 0.9 % injection 3 mL (3 mLs Intravenous Not Given 04/26/19 1042)  LORazepam (ATIVAN) tablet 1 mg (1 mg Oral Given 04/26/19 1041)    ED Course  I have reviewed the triage vital signs and the nursing notes.  Pertinent labs & imaging results that were available during my care of the patient were reviewed by me and considered in my medical decision making (see chart for details).    MDM Rules/Calculators/A&P                      24 year old male presenting for  evaluation of shortness of breath.  Was seen in the ED yesterday due to concern for shortness of breath and was also concerned about his blood sugar.  At that time was tested for Covid and also had a normal CBG.  Covid testing resulted and was negative.  Presents today for persistent symptoms.  On my evaluation initially he is not tachypneic and is satting at a percent on room air speaking in full sentences.  Throughout the evaluation and with further discussion of his symptoms he becomes very anxious and starts breathing faster and appears to look agitated and fidgety.  Continues to sat at 100% on room air throughout this time and has a normal pulse rate.  He also ambulated around the room during this time and maintain sats at 100%.  Will initiate work-up with labs, Trop, D-dimer, EKG, chest  x-ray.  11:32 AM Reassessed pt. He states he is feeling much improved after ativan and states he feels a lot more calm. He no longer has any chest pain and his sob is improved. He is concerned about a "bump" on the back of his neck. Evaluation reveals that this is his lower cervical spine. No evidence of skin infection, swelling, erythema, or mass.   CBC without leukocytosis or anemia.  BMP with normal electrolytes and kidney function.  Troponin is negative.  D-dimer is normal.  EKG is unchanged from yesterday.  It does not show any acute ischemic changes or significant/life-threatening arrhythmia.  Chest x-ray is normal.  12:20 PM reassessed patient.  He is sleeping comfortably.  He states he feels completely better after medication.  Discussed results and plan for DC with hydroxyzine.  His work-up today is unrevealing and I doubt life-threatening emergency.  I do think that he has underlying anxiety which may be contributing to his symptoms.  I advised him to follow-up with his PCP.  Advised to return if worse.  He voices understanding the plan and reasons to return.  Questions answered patient stable for discharge.    Final Clinical Impression(s) / ED Diagnoses Final diagnoses:  Dyspnea, unspecified type  Anxiety    Rx / DC Orders ED Discharge Orders         Ordered    hydrOXYzine (ATARAX/VISTARIL) 10 MG tablet  Every 8 hours PRN     04/26/19 [address], Erum Cercone S, PA-C 04/26/19 1227    Gwyneth Sprout, MD 04/26/19 1951

## 2019-04-26 NOTE — ED Triage Notes (Addendum)
Per EMS, pt from home, continues to experience SOB and chest tightness, episodes lasting 10-15 minutes.  No other symptoms.  Mostly occurs when eating and when reclining to sleep, also noticed increased belching.   140/90 68 HR 99%RA  Pt states his chest is causing him pain as well an he has a headache.

## 2019-04-26 NOTE — Discharge Instructions (Signed)
Prescription given for Hydroxyzine. Take medication as directed and do not operate machinery, drive a car, or work while taking this medication as it can make you drowsy.   Please follow up with your primary care provider within 5-7 days for re-evaluation of your symptoms. If you do not have a primary care provider, information for a healthcare clinic has been provided for you to make arrangements for follow up care. Please return to the emergency department for any new or worsening symptoms.   

## 2019-04-26 NOTE — ED Notes (Signed)
Patient verbalizes understanding of discharge instructions. Opportunity for questioning and answers were provided. Armband removed by staff, pt discharged from ED ambulatory.   

## 2019-04-29 ENCOUNTER — Emergency Department (HOSPITAL_COMMUNITY): Payer: BLUE CROSS/BLUE SHIELD

## 2019-04-29 ENCOUNTER — Emergency Department (HOSPITAL_COMMUNITY)
Admission: EM | Admit: 2019-04-29 | Discharge: 2019-04-30 | Disposition: A | Discharge status: Home / Self Care | Payer: BLUE CROSS/BLUE SHIELD | Attending: Emergency Medicine | Admitting: Emergency Medicine

## 2019-04-29 ENCOUNTER — Encounter (HOSPITAL_COMMUNITY): Payer: Self-pay | Admitting: Emergency Medicine

## 2019-04-29 ENCOUNTER — Other Ambulatory Visit: Payer: Self-pay

## 2019-04-29 DIAGNOSIS — F17290 Nicotine dependence, other tobacco product, uncomplicated: Secondary | ICD-10-CM | POA: Diagnosis not present

## 2019-04-29 DIAGNOSIS — R0602 Shortness of breath: Secondary | ICD-10-CM | POA: Diagnosis present

## 2019-04-29 DIAGNOSIS — J45909 Unspecified asthma, uncomplicated: Secondary | ICD-10-CM | POA: Diagnosis not present

## 2019-04-29 DIAGNOSIS — R0600 Dyspnea, unspecified: Secondary | ICD-10-CM | POA: Insufficient documentation

## 2019-04-29 LAB — BASIC METABOLIC PANEL
Anion gap: 6 (ref 5–15)
BUN: 8 mg/dL (ref 6–20)
CO2: 30 mmol/L (ref 22–32)
Calcium: 9.2 mg/dL (ref 8.9–10.3)
Chloride: 102 mmol/L (ref 98–111)
Creatinine, Ser: 1.09 mg/dL (ref 0.61–1.24)
GFR calc Af Amer: >60 mL/min (ref >60)
GFR calc non Af Amer: >60 mL/min (ref >60)
Glucose, Bld: 104 mg/dL — ABNORMAL HIGH (ref 70–99)
Potassium: 4 mmol/L (ref 3.5–5.1)
Sodium: 138 mmol/L (ref 135–145)

## 2019-04-29 LAB — CBC
HCT: 46.8 % (ref 39.0–52.0)
Hemoglobin: 15 g/dL (ref 13.0–17.0)
MCH: 27.3 pg (ref 26.0–34.0)
MCHC: 32.1 g/dL (ref 30.0–36.0)
MCV: 85.2 fL (ref 80.0–100.0)
Platelets: 279 10*3/uL (ref 150–400)
RBC: 5.49 MIL/uL (ref 4.22–5.81)
RDW: 12.8 % (ref 11.5–15.5)
WBC: 6 10*3/uL (ref 4.0–10.5)
nRBC: 0 % (ref 0.0–0.2)

## 2019-04-29 LAB — TROPONIN I (HIGH SENSITIVITY): Troponin I (High Sensitivity): 3 ng/L (ref <18)

## 2019-04-29 MED ORDER — SODIUM CHLORIDE 0.9% FLUSH: 3.0000 mL | Freq: Once | INTRAVENOUS | Status: DC

## 2019-04-29 NOTE — ED Triage Notes (Signed)
Pt c/o cp and sob for the past 2 weeks denies any contact with covid pt.

## 2019-04-30 LAB — TROPONIN I (HIGH SENSITIVITY): Troponin I (High Sensitivity): 3 ng/L (ref <18)

## 2019-04-30 MED ORDER — PREDNISONE 50 MG PO TABS: 50.0000 mg | ORAL_TABLET | Freq: Every day | ORAL | 0 refills | Status: DC

## 2019-04-30 MED ORDER — HYDROXYZINE HCL 25 MG PO TABS: 25.0000 mg | ORAL_TABLET | Freq: Four times a day (QID) | ORAL | 0 refills | Status: DC | PRN

## 2019-04-30 MED ORDER — PREDNISONE 20 MG PO TABS
60.0000 mg | ORAL_TABLET | Freq: Once | ORAL | Status: AC
  Administered 2019-04-30: 60 mg via ORAL
  Filled 2019-04-30: qty 3

## 2019-04-30 NOTE — ED Notes (Signed)
Pt was seen here this past week and was said to be d/c with the diagnoses of dyspnea. He is back complaining that it is not getting better. Wanted to see if he could get any more medications to help.

## 2019-04-30 NOTE — ED Provider Notes (Addendum)
[facility] EMERGENCY DEPARTMENT Provider Note   CSN: [id number] Arrival date & time: 04/29/19  2135    History Chief Complaint  Patient presents with  . Shortness of Breath    Todd Barajas is a 24 y.o. male.  The history is provided by the patient.  Shortness of Breath He has history of asthma and comes in because of an episode of shortness of breath.  For the last approximately 2 weeks, he has been having episodes where he gets very short of breath.  He has been seen several times in the emergency department and was given an inhaler which gave some slight relief.  A second time he was given hydroxyzine which seems to help a lot but takes about 30 minutes for it to work.  When he does not take the hydroxyzine, symptoms tend to resolve in about 1.5 hours.  There is a minimal cough which he describes as merely like he is clearing his throat.  He denies fever or chills.  He denies chest pain, heaviness, tightness, pressure.  He denies nausea, vomiting, diaphoresis.  Denies exposure to COVID-19.  He does smoke cigars every day.  It has been years since he had an asthma attack and cannot say if the current symptoms are like his asthma.  Symptoms are not exertional.  Past Medical History:  Diagnosis Date  . Asthma     There are no problems to display for this patient.   History reviewed. No pertinent surgical history.     No family history on file.  Social History   Tobacco Use  . Smoking status: Current Every Day Smoker    Types: Cigars  . Smokeless tobacco: Never Used  Substance Use Topics  . Alcohol use: No  . Drug use: Yes    Types: Marijuana    Home Medications Prior to Admission medications   Medication Sig Start Date End Date Taking? Authorizing Provider  hydrOXYzine (ATARAX/VISTARIL) 25 MG tablet Take 1 tablet (25 mg total) by mouth every 6 (six) hours as needed. 05/01/99   Delora Fuel, MD  predniSONE (DELTASONE) 50 MG tablet Take 1 tablet (50 mg  total) by mouth daily. 0/9/32   Delora Fuel, MD    Allergies    Patient has no known allergies.  Review of Systems   Review of Systems  Respiratory: Positive for shortness of breath.   All other systems reviewed and are negative.   Physical Exam Updated Vital Signs BP 132/86 (BP Location: Right Arm)   Pulse (!) 53   Temp 97.9 F (36.6 C) (Oral)   Resp 14   Ht 5\' 11"  (1.803 m)   Wt 99.8 kg   SpO2 99%   BMI 30.68 kg/m   Physical Exam Vitals and nursing note reviewed.   24 year old male, resting comfortably and in no acute distress. Vital signs are significant for mildly low heart rate. Oxygen saturation is 99%, which is normal. Head is normocephalic and atraumatic. PERRLA, EOMI. Oropharynx is clear. Neck is nontender and supple without adenopathy or JVD. Back is nontender and there is no CVA tenderness. Lungs are clear without rales, wheezes, or rhonchi.  With forced exhalation, there is no wheezing and no prolonged exhalation phase. Chest is nontender. Heart has regular rate and rhythm without murmur. Abdomen is soft, flat, nontender without masses or hepatosplenomegaly and peristalsis is normoactive. Extremities have no cyanosis or edema, full range of motion is present. Skin is warm and dry without rash. Neurologic: Mental  status is normal, cranial nerves are intact, there are no motor or sensory deficits.  ED Results / Procedures / Treatments   Labs (all labs ordered are listed, but only abnormal results are displayed) Labs Reviewed  BASIC METABOLIC PANEL - Abnormal; Notable for the following components:      Result Value   Glucose, Bld 104 (*)    All other components within normal limits  CBC  TROPONIN I (HIGH SENSITIVITY)  TROPONIN I (HIGH SENSITIVITY)   ECG Interpretation: 04/29/2019. Normal sinus rhythm 67 beats per minute. Normal axis, normal intervals. Nonspecific T wave changes. When compared with ECG of 04/26/2019, no significant changes are seen.    04/30/2019. Sinus bradycardia 57 beats per minute. Normal axis, normal intervals. Nonspecific T wave changes. When compared with ECG of 04/29/2019, no significant changes are seen.  Radiology DG Chest 2 View  Result Date: 04/29/2019 CLINICAL DATA:  Chest pain, shortness of breath EXAM: CHEST - 2 VIEW COMPARISON:  04/26/2019 FINDINGS: The heart size and mediastinal contours are within normal limits. Both lungs are clear. The visualized skeletal structures are unremarkable. IMPRESSION: No active cardiopulmonary disease. Electronically Signed   By: Alcide Clever M.D.   On: 04/29/2019 22:34    Procedures Procedures  Medications Ordered in ED Medications  sodium chloride flush (NS) 0.9 % injection 3 mL (has no administration in time range)  predniSONE (DELTASONE) tablet 60 mg (60 mg Oral Given 04/30/19 8657)    ED Course  I have reviewed the triage vital signs and the nursing notes.  Pertinent labs & imaging results that were available during my care of the patient were reviewed by me and considered in my medical decision making (see chart for details).  MDM Rules/Calculators/A&P Dyspnea which had resolved by the time he arrived in the ED.  Old records reviewed confirming to ED visits within the past week for similar symptoms he was given an albuterol inhaler at the first visit and prescribed hydroxyzine 10 mg at the second visit.  Work-up has been negative including normal D-dimer.  Today, chest x-ray is unremarkable and labs are normal except for minimally elevated glucose of 104.  The fact that he gets relief with hydroxyzine certainly suggests an anxiety component.  He does have a prior ED visit for asthma and this was about 2 years ago.  I do wonder if he is having episodic bronchospasm.  He is sent home with a 5-day course of prednisone and is given a prescription for additional hydroxyzine and is referred to pulmonology for consideration for pulmonary function testing.  Final Clinical  Impression(s) / ED Diagnoses Final diagnoses:  Dyspnea, unspecified type    Rx / DC Orders ED Discharge Orders         Ordered    predniSONE (DELTASONE) 50 MG tablet  Daily     04/30/19 0614    hydrOXYzine (ATARAX/VISTARIL) 25 MG tablet  Every 6 hours PRN,   Status:  Discontinued     04/30/19 0614    Ambulatory referral to Pulmonology     04/30/19 0614    hydrOXYzine (ATARAX/VISTARIL) 25 MG tablet  Every 6 hours PRN     04/30/19 0619           Dione Booze, MD 04/30/19 8469    Dione Booze, MD 05/18/19 [phone]

## 2019-04-30 NOTE — ED Notes (Signed)
Patient verbalizes understanding of discharge instructions. Opportunity for questioning and answers were provided. Armband removed by staff, pt discharged from ED. Pt. ambulatory and discharged home.  

## 2019-04-30 NOTE — Discharge Instructions (Signed)
Your testing today did not show any serious cause for your breathing problems. Please follow up with the lung specialist to get additional testing.  Do not use any tobacco products - they can all affect your breathing.  Use your inhaler as needed. Take the hydroxyzine as needed.  Return if symptoms are getting worse.

## 2019-05-03 ENCOUNTER — Institutional Professional Consult (permissible substitution): Payer: BLUE CROSS/BLUE SHIELD | Admitting: Pulmonary Disease

## 2019-05-06 ENCOUNTER — Institutional Professional Consult (permissible substitution): Payer: BLUE CROSS/BLUE SHIELD | Admitting: Pulmonary Disease

## 2019-05-07 ENCOUNTER — Encounter: Payer: Self-pay | Admitting: Critical Care Medicine

## 2019-05-07 ENCOUNTER — Ambulatory Visit (INDEPENDENT_AMBULATORY_CARE_PROVIDER_SITE_OTHER): Payer: BLUE CROSS/BLUE SHIELD | Admitting: Critical Care Medicine

## 2019-05-07 ENCOUNTER — Other Ambulatory Visit: Payer: Self-pay

## 2019-05-07 VITALS — BP 140/80 | HR 62 | Temp 97.2°F | Ht 71.0 in | Wt 216.6 lb

## 2019-05-07 DIAGNOSIS — Z87891 Personal history of nicotine dependence: Secondary | ICD-10-CM | POA: Diagnosis not present

## 2019-05-07 DIAGNOSIS — K219 Gastro-esophageal reflux disease without esophagitis: Secondary | ICD-10-CM | POA: Diagnosis not present

## 2019-05-07 DIAGNOSIS — R0602 Shortness of breath: Secondary | ICD-10-CM | POA: Diagnosis not present

## 2019-05-07 MED ORDER — PANTOPRAZOLE SODIUM 20 MG PO TBEC: 20.0000 mg | DELAYED_RELEASE_TABLET | Freq: Every day | ORAL | 1 refills | Status: DC

## 2019-05-07 MED ORDER — FLUTICASONE PROPIONATE 50 MCG/ACT NA SUSP: 2.0000 | Freq: Every day | NASAL | 2 refills | Status: DC

## 2019-05-07 NOTE — Patient Instructions (Addendum)
Thank you for visiting Dr. Carlis Abbott at [facility]. We recommend the following: Orders Placed This Encounter  Procedures  . Pulmonary function test   Orders Placed This Encounter  Procedures  . Pulmonary function test    Standing Status:   Future    Standing Expiration Date:   05/06/2020    Order Specific Question:   Where should this test be performed?    Answer:   [facility]    Order Specific Question:   Full PFT: includes the following: basic spirometry, spirometry pre & post bronchodilator, diffusion capacity (DLCO), lung volumes    Answer:   Full PFT    Meds ordered this encounter  Medications  . pantoprazole (PROTONIX) 20 MG tablet    Sig: Take 1 tablet (20 mg total) by mouth daily.    Dispense:  30 tablet    Refill:  1  . fluticasone (FLONASE) 50 MCG/ACT nasal spray    Sig: Place 2 sprays into both nostrils daily.    Dispense:  16 g    Refill:  2    Return in about 4 weeks (around 06/04/2019).    Please do your part to reduce the spread of COVID-19.   Food Choices for Gastroesophageal Reflux Disease, Adult When you have gastroesophageal reflux disease (GERD), the foods you eat and your eating habits are very important. Choosing the right foods can help ease your discomfort. Think about working with a nutrition specialist (dietitian) to help you make good choices. What are tips for following this plan?  Meals  Choose healthy foods that are low in fat, such as fruits, vegetables, whole grains, low-fat dairy products, and lean meat, fish, and poultry.  Eat small meals often instead of 3 large meals a day. Eat your meals slowly, and in a place where you are relaxed. Avoid bending over or lying down until 2-3 hours after eating.  Avoid eating meals 2-3 hours before bed.  Avoid drinking a lot of liquid with meals.  Cook foods using methods other than frying. Bake, grill, or broil food instead.  Avoid or limit: ? Chocolate. ? Peppermint or spearmint. ?  Alcohol. ? Pepper. ? Black and decaffeinated coffee. ? Black and decaffeinated tea. ? Bubbly (carbonated) soft drinks. ? Caffeinated energy drinks and soft drinks.  Limit high-fat foods such as: ? Fatty meat or fried foods. ? Whole milk, cream, butter, or ice cream. ? Nuts and nut butters. ? Pastries, donuts, and sweets made with butter or shortening.  Avoid foods that cause symptoms. These foods may be different for everyone. Common foods that cause symptoms include: ? Tomatoes. ? Oranges, lemons, and limes. ? Peppers. ? Spicy food. ? Onions and garlic. ? Vinegar. Lifestyle  Maintain a healthy weight. Ask your doctor what weight is healthy for you. If you need to lose weight, work with your doctor to do so safely.  Exercise for at least 30 minutes for 5 or more days each week, or as told by your doctor.  Wear loose-fitting clothes.  Do not smoke. If you need help quitting, ask your doctor.  Sleep with the head of your bed higher than your feet. Use a wedge under the mattress or blocks under the bed frame to raise the head of the bed. Summary  When you have gastroesophageal reflux disease (GERD), food and lifestyle choices are very important in easing your symptoms.  Eat small meals often instead of 3 large meals a day. Eat your meals slowly, and in a place  where you are relaxed.  Limit high-fat foods such as fatty meat or fried foods.  Avoid bending over or lying down until 2-3 hours after eating.  Avoid peppermint and spearmint, caffeine, alcohol, and chocolate. This information is not intended to replace advice given to you by your health care provider. Make sure you discuss any questions you have with your health care provider. Document Revised: 08/02/2018 Document Reviewed: 05/17/2016 Elsevier Patient Education  2020 [employer].

## 2019-05-07 NOTE — Progress Notes (Signed)
Synopsis: Referred in January 2021 for SOB by Dione Booze, MD.  Subjective:   PATIENT ID: Todd Barajas GENDER: male DOB: Jun 06, 1995, MRN: [id number]  Chief Complaint  Patient presents with  . Consult    Patient is here for shortness of breath that comes and goes but is worse at night or after eating cold food. Patient is currently taking hydroxyzine and prednisone.    [name] is a 24 year old gentleman who was referred for evaluation of shortness of breath and cough.  He was referred after multiple ED visits for the symptoms.  This began about a month ago with sudden onset.  In the ED he did not feel that albuterol helped much, but is used a few times as an outpatient since.  At one point he was taking it every 4 hours but was still having breakthrough symptoms.  When he continued to have symptoms he return to the emergency department, where he was treated with hydroxyzine due to concern for anxiety.  At his last visit they prescribed prednisone.  He has had some improvement with prednisone, but he has been taking half dose to make good dose last longer.  He had an episode where he had trouble swallowing when he is taking the prednisone later in the day, he felt that he had to take small bites and chew his food well because the food would not go down.  Although he has had these episodes since he was on prednisone, he has not had the "attacks" he feels like he cannot breathe.  His episodes of shortness of breath have not occurred with exertion; they tend to have been worse at night.  He feels as though his speech is truncated during these episodes.  He notices belching frequently with these, which often resolves his symptoms.  He has had cough with throat clearing.  He gets heartburn at night, especially when he lays flat, and this correlates with when he has trouble breathing and central chest tightness.  He notices the symptoms more commonly when he has been drinking or eating dairy products.   He denies hoarseness or water brash.  He eats right up until the time he goes to bed, often times pizza and anything that he wants.  He drinks 2-3 sodas per day.  When his symptoms began he stopped drinking alcohol and smoking cigars and marijuana.  He had been smoking marijuana daily for the last 6 years and Black and mild cigars, about 1 pack/week for the last 6 years.  He does not take NSAIDs. Reports having sinus symptoms that have improved with prednisone.  He describes being diagnosed with asthma at the age of around 68, which resolved around age 18.  He does not think that it was asthma, rather being heavy and deconditioned. His symptoms then were less severe than now.  He never used inhalers or required hospitalization.  His father had asthma, but he is unsure if his siblings have health issues.  He does not feel like his symptoms are similar to asthma.  He reports that he was seen by ENT in the past and started on medication, but he is not sure what it was for.     Past Medical History:  Diagnosis Date  . Asthma      Family History  Problem Relation Age of Onset  . Colon cancer Mother   . Asthma Father   . Hypertension Father   . Stroke Father   . Heart disease Father  History reviewed. No pertinent surgical history. No previous surgeries.  Social History   Socioeconomic History  . Marital status: Single    Spouse name: Not on file  . Number of children: Not on file  . Years of education: Not on file  . Highest education level: Not on file  Occupational History  . Not on file  Tobacco Use  . Smoking status: Former Smoker    Packs/day: 0.20    Years: 6.00    Pack years: 1.20    Types: Cigars    Start date: 2014    Quit date: 01/04/2019    Years since quitting: 0.3  . Smokeless tobacco: Never Used  Substance and Sexual Activity  . Alcohol use: No  . Drug use: Not Currently    Types: Marijuana  . Sexual activity: Not on file  Other Topics Concern  . Not on  file  Social History Narrative  . Not on file   Social Determinants of Health   Financial Resource Strain:   . Difficulty of Paying Living Expenses: Not on file  Food Insecurity:   . Worried About Programme researcher, broadcasting/film/video in the Last Year: Not on file  . Ran Out of Food in the Last Year: Not on file  Transportation Needs:   . Lack of Transportation (Medical): Not on file  . Lack of Transportation (Non-Medical): Not on file  Physical Activity:   . Days of Exercise per Week: Not on file  . Minutes of Exercise per Session: Not on file  Stress:   . Feeling of Stress : Not on file  Social Connections:   . Frequency of Communication with Friends and Family: Not on file  . Frequency of Social Gatherings with Friends and Family: Not on file  . Attends Religious Services: Not on file  . Active Member of Clubs or Organizations: Not on file  . Attends Banker Meetings: Not on file  . Marital Status: Not on file  Intimate Partner Violence:   . Fear of Current or Ex-Partner: Not on file  . Emotionally Abused: Not on file  . Physically Abused: Not on file  . Sexually Abused: Not on file     No Known Allergies    There is no immunization history on file for this patient.  Outpatient Medications Prior to Visit  Medication Sig Dispense Refill  . hydrOXYzine (ATARAX/VISTARIL) 25 MG tablet Take 1 tablet (25 mg total) by mouth every 6 (six) hours as needed. 60 tablet 0  . naproxen sodium (ALEVE) 220 MG tablet Take 220 mg by mouth as needed.    . predniSONE (DELTASONE) 50 MG tablet Take 1 tablet (50 mg total) by mouth daily. 5 tablet 0   No facility-administered medications prior to visit.    Review of Systems  Constitutional: Negative for chills, fever and weight loss.  HENT: Positive for congestion.   Respiratory: Positive for cough and shortness of breath.   Cardiovascular: Positive for chest pain.  Gastrointestinal: Positive for abdominal pain and heartburn.        Difficulty swallowing  Neurological: Positive for headaches.  Psychiatric/Behavioral: Positive for depression. The patient is nervous/anxious.      Objective:   Vitals:   05/07/19 0916  BP: 140/80  Pulse: 62  Temp: (!) 97.2 F (36.2 C)  TempSrc: Temporal  SpO2: 96%  Weight: 216 lb 9.6 oz (98.2 kg)  Height: 5\' 11"  (1.803 m)   96% on   RA BMI Readings from Last  3 Encounters:  05/07/19 30.21 kg/m  04/29/19 30.68 kg/m  04/22/19 30.68 kg/m   Wt Readings from Last 3 Encounters:  05/07/19 216 lb 9.6 oz (98.2 kg)  04/29/19 220 lb (99.8 kg)  04/22/19 220 lb (99.8 kg)    Physical Exam Vitals reviewed.  Constitutional:      General: He is not in acute distress.    Appearance: Normal appearance. He is not ill-appearing.  HENT:     Head: Normocephalic and atraumatic.     Nose:     Comments: Deferred due to masking requirement.    Mouth/Throat:     Comments: Deferred due to masking requirement. Eyes:     General: No scleral icterus. Cardiovascular:     Rate and Rhythm: Normal rate and regular rhythm.     Heart sounds: No murmur.  Pulmonary:     Comments: Breathing comfortably on room air, normal speech.  No conversational dyspnea.  Clear to auscultation bilaterally. Abdominal:     General: There is no distension.     Palpations: Abdomen is soft.     Tenderness: There is no abdominal tenderness.  Musculoskeletal:        General: No swelling or deformity.     Cervical back: Neck supple.  Lymphadenopathy:     Cervical: No cervical adenopathy.  Skin:    General: Skin is warm and dry.     Findings: No rash.  Neurological:     General: No focal deficit present.     Mental Status: He is alert.     Motor: No weakness.     Coordination: Coordination normal.  Psychiatric:        Mood and Affect: Mood normal.        Behavior: Behavior normal.      CBC    Component Value Date/Time   WBC 6.0 04/29/2019 2224   RBC 5.49 04/29/2019 2224   HGB 15.0 04/29/2019 2224    HCT 46.8 04/29/2019 2224   PLT 279 04/29/2019 2224   MCV 85.2 04/29/2019 2224   MCH 27.3 04/29/2019 2224   MCHC 32.1 04/29/2019 2224   RDW 12.8 04/29/2019 2224   LYMPHSABS 1.2 01/16/2019 1238   MONOABS 0.5 01/16/2019 1238   EOSABS 0.1 01/16/2019 1238   BASOSABS 0.0 01/16/2019 1238    CHEMISTRY No results for input(s): NA, K, CL, CO2, GLUCOSE, BUN, CREATININE, CALCIUM, MG, PHOS in the last 168 hours. Estimated Creatinine Clearance: 124.9 mL/min (by C-G formula based on SCr of 1.09 mg/dL).  SARS-CoV-2 negative (04/22/2019)  Chest Imaging- films reviewed: CXR, 2 view 04/29/2019-mildly increased interstitial markings.  No focal opacities.  CT abdomen pelvis 01/16/2019, lung images reviewed-normal lung bases.  Pulmonary Functions Testing Results: No flowsheet data found.      Assessment & Plan:     ICD-10-CM   1. SOB (shortness of breath)  R06.02 Pulmonary function test  2. Gastroesophageal reflux disease, unspecified whether esophagitis present  K21.9 Pulmonary function test  3. History of tobacco use  Z87.891     GERD likely causing occasional dyspnea -Start pantoprazole once daily.  Recommended to take 30 minutes before breakfast. -Discussed lifestyle and dietary modifications necessary to control GERD.  He needs to focus on stopping eating several hours before going to bed, especially avoiding fatty foods.  He should reduce his carbonated beverage intake. -Recommend against additional doses of steroids unless he is obviously wheezing on exam -PFTs -Avoid NSAIDs -Continue to avoid alcohol  History of tobacco abuse -Applauded his efforts at  quitting smoking tobacco and marijuana.  Recommended ongoing cessation.  Allergic rhinosinusitis -Flonase 2 sprays bilaterally once daily  Recommend ongoing COVID-19 precautions-mask wearing, social distancing, handwashing.  Recommend receiving the vaccine once it is available to his age group.  RTC in 4 weeks.   Current Outpatient  Medications:  .  hydrOXYzine (ATARAX/VISTARIL) 25 MG tablet, Take 1 tablet (25 mg total) by mouth every 6 (six) hours as needed., Disp: 60 tablet, Rfl: 0 .  naproxen sodium (ALEVE) 220 MG tablet, Take 220 mg by mouth as needed., Disp: , Rfl:  .  predniSONE (DELTASONE) 50 MG tablet, Take 1 tablet (50 mg total) by mouth daily., Disp: 5 tablet, Rfl: 0 .  fluticasone (FLONASE) 50 MCG/ACT nasal spray, Place 2 sprays into both nostrils daily., Disp: 16 g, Rfl: 2 .  pantoprazole (PROTONIX) 20 MG tablet, Take 1 tablet (20 mg total) by mouth daily., Disp: 30 tablet, Rfl: 1   I spent 55 minutes on this encounter, including face to face time and non-face to face time spent reviewing records, charting, coordinating care, etc.   Steffanie Dunn, DO  Pulmonary Critical Care 05/07/2019 5:49 PM

## 2019-05-17 ENCOUNTER — Telehealth: Payer: Self-pay | Admitting: Critical Care Medicine

## 2019-05-17 DIAGNOSIS — Z87891 Personal history of nicotine dependence: Secondary | ICD-10-CM

## 2019-05-17 DIAGNOSIS — R0602 Shortness of breath: Secondary | ICD-10-CM

## 2019-05-17 DIAGNOSIS — K219 Gastro-esophageal reflux disease without esophagitis: Secondary | ICD-10-CM

## 2019-05-17 NOTE — Telephone Encounter (Signed)
Spoke with pt, he is requesting a neurology appt because he is having headaches that shoot pain to the back of his head. I asked hi did he have a primary care doctor and he stated he didn't have one. I don't see one documented in Epic. Dr. Chestine Spore would you be willing to send in a referral for pt? Please advise.

## 2019-05-17 NOTE — Telephone Encounter (Signed)
No he can have a PCP referral. They can decide if he needs to see Neurology.  LPC

## 2019-05-20 NOTE — Telephone Encounter (Signed)
Patient is returning phone call.  Patient phone number is [phone].

## 2019-05-20 NOTE — Telephone Encounter (Signed)
Called the patient and made him aware of the response. Patient voiced understanding. Referral for PCP placed. Nothing further needed at this time.

## 2019-05-22 ENCOUNTER — Emergency Department (HOSPITAL_COMMUNITY): Payer: BLUE CROSS/BLUE SHIELD

## 2019-05-22 ENCOUNTER — Other Ambulatory Visit: Payer: Self-pay

## 2019-05-22 ENCOUNTER — Encounter (HOSPITAL_COMMUNITY): Payer: Self-pay | Admitting: Emergency Medicine

## 2019-05-22 ENCOUNTER — Emergency Department (HOSPITAL_COMMUNITY)
Admission: EM | Admit: 2019-05-22 | Discharge: 2019-05-22 | Disposition: A | Discharge status: Home / Self Care | Payer: BLUE CROSS/BLUE SHIELD | Attending: Emergency Medicine | Admitting: Emergency Medicine

## 2019-05-22 DIAGNOSIS — J45909 Unspecified asthma, uncomplicated: Secondary | ICD-10-CM | POA: Insufficient documentation

## 2019-05-22 DIAGNOSIS — F1210 Cannabis abuse, uncomplicated: Secondary | ICD-10-CM | POA: Insufficient documentation

## 2019-05-22 DIAGNOSIS — R0981 Nasal congestion: Secondary | ICD-10-CM | POA: Insufficient documentation

## 2019-05-22 DIAGNOSIS — Z87891 Personal history of nicotine dependence: Secondary | ICD-10-CM | POA: Diagnosis not present

## 2019-05-22 DIAGNOSIS — Z79899 Other long term (current) drug therapy: Secondary | ICD-10-CM | POA: Insufficient documentation

## 2019-05-22 DIAGNOSIS — R519 Headache, unspecified: Secondary | ICD-10-CM | POA: Diagnosis not present

## 2019-05-22 LAB — I-STAT CHEM 8, ED
BUN: 12 mg/dL (ref 6–20)
Calcium, Ion: 1.22 mmol/L (ref 1.15–1.40)
Chloride: 102 mmol/L (ref 98–111)
Creatinine, Ser: 1 mg/dL (ref 0.61–1.24)
Glucose, Bld: 84 mg/dL (ref 70–99)
HCT: 51 % (ref 39.0–52.0)
Hemoglobin: 17.3 g/dL — ABNORMAL HIGH (ref 13.0–17.0)
Potassium: 4.3 mmol/L (ref 3.5–5.1)
Sodium: 137 mmol/L (ref 135–145)
TCO2: 29 mmol/L (ref 22–32)

## 2019-05-22 MED ORDER — AMOXICILLIN-POT CLAVULANATE 875-125 MG PO TABS: 1.0000 | ORAL_TABLET | Freq: Two times a day (BID) | ORAL | 0 refills | Status: DC

## 2019-05-22 MED ORDER — ONDANSETRON HCL 4 MG/2ML IJ SOLN
4.0000 mg | Freq: Once | INTRAMUSCULAR | Status: AC
  Administered 2019-05-22: 16:00:00 4 mg via INTRAVENOUS
  Filled 2019-05-22: qty 2

## 2019-05-22 MED ORDER — FLUTICASONE PROPIONATE 50 MCG/ACT NA SUSP: 2.0000 | Freq: Every day | NASAL | 2 refills | Status: DC

## 2019-05-22 MED ORDER — KETOROLAC TROMETHAMINE 15 MG/ML IJ SOLN
15.0000 mg | Freq: Once | INTRAMUSCULAR | Status: AC
  Administered 2019-05-22: 16:00:00 15 mg via INTRAVENOUS
  Filled 2019-05-22: qty 1

## 2019-05-22 MED ORDER — SODIUM CHLORIDE 0.9 % IV BOLUS
500.0000 mL | Freq: Once | INTRAVENOUS | Status: AC
  Administered 2019-05-22: 500 mL via INTRAVENOUS

## 2019-05-22 NOTE — Discharge Instructions (Addendum)
Please follow-up with [facility] should your headache symptoms fail to improve with Augmentin, Flonase, and nasal rinses.  I suspect that you have a sinus headache.  Please read the attachment on how to perform a sinus rinse.  Is important that you perform these procedures regularly.  Please return to the ED or seek immediate medical attention for any new or worsening symptoms.

## 2019-05-22 NOTE — ED Provider Notes (Signed)
[facility] EMERGENCY DEPARTMENT Provider Note   CSN: [id number] Arrival date & time: 05/22/19  1337     History Chief Complaint  Patient presents with  . Headache    Todd Barajas is a 24 y.o. male with PMH significant for asthma who was recently evaluated on in the ED 04/29/2019 for dyspnea treated with hydroxyzine and prednisone burst as well as an ambulatory referral to pulmonology.  Patient also endorses symptoms of reflux disease and was treated with pantoprazole after being evaluated by pulmonology.  He was also prescribed Flonase for allergic rhinosinusitis, however he reports that he has not been using it as prescribed.  Patient reports that he has been having a headache for approximately 2 weeks that has been constant with focal discomfort in left-sided posterior region.  He also reports worsening headache with leaning forward.  He endorses continued nasal congestion symptoms, but his chest congestion and dyspnea improved with the prednisone.  He denies any fevers or chills, neck stiffness, recent injury, blurred vision, numbness or weakness, other neurological deficits, nausea or vomiting, photosensitivity, or other symptoms.  He has been attempting to treat his symptoms with ibuprofen, with little relief.  He is concerned that he has a mass in his brain as he does not usually get headaches, particularly ones that feel similar to this one.   HPI     Past Medical History:  Diagnosis Date  . Asthma     There are no problems to display for this patient.   History reviewed. No pertinent surgical history.     Family History  Problem Relation Age of Onset  . Colon cancer Mother   . Asthma Father   . Hypertension Father   . Stroke Father   . Heart disease Father     Social History   Tobacco Use  . Smoking status: Former Smoker    Packs/day: 0.20    Years: 6.00    Pack years: 1.20    Types: Cigars    Start date: 2014    Quit date: 01/04/2019   Years since quitting: 0.3  . Smokeless tobacco: Never Used  Substance Use Topics  . Alcohol use: No  . Drug use: Not Currently    Types: Marijuana    Home Medications Prior to Admission medications   Medication Sig Start Date End Date Taking? Authorizing Provider  amoxicillin-clavulanate (AUGMENTIN) 875-125 MG tablet Take 1 tablet by mouth every 12 (twelve) hours. 05/22/19   Corena Herter, PA-C  fluticasone (FLONASE) 50 MCG/ACT nasal spray Place 2 sprays into both nostrils daily for 21 days. 05/22/19 06/12/19  Corena Herter, PA-C  hydrOXYzine (ATARAX/VISTARIL) 25 MG tablet Take 1 tablet (25 mg total) by mouth every 6 (six) hours as needed. 2/0/25   Delora Fuel, MD  naproxen sodium (ALEVE) 220 MG tablet Take 220 mg by mouth as needed.    [provider]  pantoprazole (PROTONIX) 20 MG tablet Take 1 tablet (20 mg total) by mouth daily. 05/07/19   Julian Hy, DO  predniSONE (DELTASONE) 50 MG tablet Take 1 tablet (50 mg total) by mouth daily. 07/25/68   Delora Fuel, MD    Allergies    Patient has no known allergies.  Review of Systems   Review of Systems  All other systems reviewed and are negative.   Physical Exam Updated Vital Signs BP 121/79 (BP Location: Right Arm)   Pulse 66   Temp 97.8 F (36.6 C) (Oral)   Resp 16  SpO2 99%   Physical Exam Vitals and nursing note reviewed. Exam conducted with a chaperone present.  Constitutional:      General: He is not in acute distress.    Appearance: Normal appearance.  HENT:     Head: Normocephalic and atraumatic.     Comments: Maxillary and frontal sinus tenderness palpation.  Headache exacerbated with leaning forward.    Nose: Congestion and rhinorrhea present.     Mouth/Throat:     Mouth: Mucous membranes are moist.     Pharynx: No oropharyngeal exudate or posterior oropharyngeal erythema.  Eyes:     General: No visual field deficit or scleral icterus.    Extraocular Movements: Extraocular movements intact.      Conjunctiva/sclera: Conjunctivae normal.     Pupils: Pupils are equal, round, and reactive to light.  Neck:     Meningeal: Brudzinski's sign absent.     Comments: No meningismus.  No midline cervical tenderness to palpation or trapezial tenderness. Cardiovascular:     Rate and Rhythm: Normal rate and regular rhythm.     Pulses: Normal pulses.     Heart sounds: Normal heart sounds.  Pulmonary:     Effort: Pulmonary effort is normal. No respiratory distress.     Breath sounds: Normal breath sounds.  Musculoskeletal:     Cervical back: Normal range of motion and neck supple. No rigidity.  Skin:    General: Skin is dry.     Capillary Refill: Capillary refill takes less than 2 seconds.  Neurological:     Mental Status: He is alert and oriented to person, place, and time.     GCS: GCS eye subscore is 4. GCS verbal subscore is 5. GCS motor subscore is 6.     Cranial Nerves: No cranial nerve deficit.     Sensory: No sensory deficit.     Motor: No weakness.     Coordination: Romberg sign negative. Coordination normal.     Gait: Gait normal.     Comments: CN II through XII grossly intact.  Negative cerebellar exam.  Psychiatric:        Mood and Affect: Mood normal.        Behavior: Behavior normal.        Thought Content: Thought content normal.     ED Results / Procedures / Treatments   Labs (all labs ordered are listed, but only abnormal results are displayed) Labs Reviewed  I-STAT CHEM 8, ED - Abnormal; Notable for the following components:      Result Value   Hemoglobin 17.3 (*)    All other components within normal limits  POC URINE PREG, ED    EKG None  Radiology CT Head Wo Contrast  Result Date: 05/22/2019 CLINICAL DATA:  Acute onset of headache, worse with motion or bending. EXAM: CT HEAD WITHOUT CONTRAST TECHNIQUE: Contiguous axial images were obtained from the base of the skull through the vertex without intravenous contrast. COMPARISON:  None. FINDINGS: Brain: The  brain shows a normal appearance without evidence of malformation, atrophy, old or acute small or large vessel infarction, mass lesion, hemorrhage, hydrocephalus or extra-axial collection. Vascular: No hyperdense vessel. No evidence of atherosclerotic calcification. Skull: Normal.  No traumatic finding.  No focal bone lesion. Sinuses/Orbits: Sinuses are clear. Orbits appear normal. Mastoids are clear. Other: None significant IMPRESSION: Normal head CT Electronically Signed   By: Paulina Fusi M.D.   On: 05/22/2019 17:20    Procedures Procedures (including critical care time)  Medications Ordered in  ED Medications  ketorolac (TORADOL) 15 MG/ML injection 15 mg (15 mg Intravenous Given 05/22/19 1539)  ondansetron (ZOFRAN) injection 4 mg (4 mg Intravenous Given 05/22/19 1537)  sodium chloride 0.9 % bolus 500 mL (0 mLs Intravenous Stopped 05/22/19 1721)    ED Course  I have reviewed the triage vital signs and the nursing notes.  Pertinent labs & imaging results that were available during my care of the patient were reviewed by me and considered in my medical decision making (see chart for details).    MDM Rules/Calculators/A&P                      Given chronicity of his constant, unrelenting headache not improved with NSAIDs, and given the fact that he feels focal discomfort in left-sided posterior region that is entirely new for him, obtained CT without contrast which was normal and without any intracranial abnormalities.  His i-STAT Chem-8 was also reassuring as he is not anemic or with electrolyte derangement.  He is hemodynamically stable and his vital signs have been within normal limits throughout the entire duration of his stay in the ED.  He is neurovascularly intact and denies any neurologic deficits.  Patient reports that he has been doing with nasal congestion and states that his headache is worse with leaning forward.  He had been prescribed Flonase by his pulmonologist, but admits that he  has not been using it.  He also had maxillary and frontal sinus tenderness to palpation on my physical exam, consistent with a sinus infection.  While he does not endorse any fevers or chills as well as any purulent drainage, given the chronicity of his illness, will treat with Augmentin.  Also I am strongly encouraging that he fill his prescription for Flonase as I believe that that would be the best management for him at this time.  I will also prescribe course of antihistamines and encourage nasal rinses.  Patient is requesting neurology follow-up.  Given his subacute headache, feel as though it may be appropriate for further evaluation and management.  Gust strict return precautions with the patient. All of the evaluation and work-up results were discussed with the patient and any family at bedside. They were provided opportunity to ask any additional questions and have none at this time. They have expressed understanding of verbal discharge instructions as well as return precautions and are agreeable to the plan.    Final Clinical Impression(s) / ED Diagnoses Final diagnoses:  Intractable headache, unspecified chronicity pattern, unspecified headache type    Rx / DC Orders ED Discharge Orders         Ordered    amoxicillin-clavulanate (AUGMENTIN) 875-125 MG tablet  Every 12 hours     05/22/19 1808    fluticasone (FLONASE) 50 MCG/ACT nasal spray  Daily     05/22/19 1808           Lorelee New, PA-C 05/22/19 Crissie Figures, MD 05/23/19 1013

## 2019-05-22 NOTE — ED Triage Notes (Signed)
Pt c/o headache without photosensitivity nausea or vomiting. The pain is in the back of his head and gets worse with motion or bending over. A/O NAD at triage.

## 2019-05-29 ENCOUNTER — Other Ambulatory Visit: Payer: Self-pay | Admitting: Critical Care Medicine

## 2019-06-06 ENCOUNTER — Ambulatory Visit: Payer: BLUE CROSS/BLUE SHIELD | Admitting: Neurology

## 2019-06-06 ENCOUNTER — Telehealth: Payer: Self-pay | Admitting: Neurology

## 2019-06-06 NOTE — Telephone Encounter (Signed)
Pt states due to a schedule conflict he can not make today's appointment

## 2019-06-12 ENCOUNTER — Ambulatory Visit: Payer: BLUE CROSS/BLUE SHIELD | Admitting: Critical Care Medicine

## 2019-06-12 NOTE — Progress Notes (Deleted)
Synopsis: Referred in January 2021 for SOB by No ref. provider found.  Subjective:   PATIENT ID: Todd Barajas GENDER: male DOB: 05-14-1995, MRN: [id number]  No chief complaint on file.   HPI   GERD causing dyspnea-started pantoprazole Tobacco abuse Allergic rhinitis-prescribed Flonase, was not taking recently  ED visit 1/27-sinus infection, Augmentin, told to start Flonase   OV 05/07/19: Todd Barajas is a 24 year old gentleman who was referred for evaluation of shortness of breath and cough.  He was referred after multiple ED visits for the symptoms.  This began about a month ago with sudden onset.  In the ED he did not feel that albuterol helped much, but is used a few times as an outpatient since.  At one point he was taking it every 4 hours but was still having breakthrough symptoms.  When he continued to have symptoms he return to the emergency department, where he was treated with hydroxyzine due to concern for anxiety.  At his last visit they prescribed prednisone.  He has had some improvement with prednisone, but he has been taking half dose to make good dose last longer.  He had an episode where he had trouble swallowing when he is taking the prednisone later in the day, he felt that he had to take small bites and chew his food well because the food would not go down.  Although he has had these episodes since he was on prednisone, he has not had the "attacks" he feels like he cannot breathe.  His episodes of shortness of breath have not occurred with exertion; they tend to have been worse at night.  He feels as though his speech is truncated during these episodes.  He notices belching frequently with these, which often resolves his symptoms.  He has had cough with throat clearing.  He gets heartburn at night, especially when he lays flat, and this correlates with when he has trouble breathing and central chest tightness.  He notices the symptoms more commonly when he has been drinking or  eating dairy products.  He denies hoarseness or water brash.  He eats right up until the time he goes to bed, often times pizza and anything that he wants.  He drinks 2-3 sodas per day.  When his symptoms began he stopped drinking alcohol and smoking cigars and marijuana.  He had been smoking marijuana daily for the last 6 years and Black and mild cigars, about 1 pack/week for the last 6 years.  He does not take NSAIDs. Reports having sinus symptoms that have improved with prednisone.  He describes being diagnosed with asthma at the age of around 52, which resolved around age 13.  He does not think that it was asthma, rather being heavy and deconditioned. His symptoms then were less severe than now.  He never used inhalers or required hospitalization.  His father had asthma, but he is unsure if his siblings have health issues.  He does not feel like his symptoms are similar to asthma.  He reports that he was seen by ENT in the past and started on medication, but he is not sure what it was for.   Past Medical History:  Diagnosis Date   Asthma      Family History  Problem Relation Age of Onset   Colon cancer Mother    Asthma Father    Hypertension Father    Stroke Father    Heart disease Father      No past surgical history  on file. No previous surgeries.  Social History   Socioeconomic History   Marital status: Single    Spouse name: Not on file   Number of children: Not on file   Years of education: Not on file   Highest education level: Not on file  Occupational History   Not on file  Tobacco Use   Smoking status: Former Smoker    Packs/day: 0.20    Years: 6.00    Pack years: 1.20    Types: Cigars    Start date: 2014    Quit date: 01/04/2019    Years since quitting: 0.4   Smokeless tobacco: Never Used  Substance and Sexual Activity   Alcohol use: No   Drug use: Not Currently    Types: Marijuana   Sexual activity: Not on file  Other Topics Concern   Not  on file  Social History Narrative   Not on file   Social Determinants of Health   Financial Resource Strain:    Difficulty of Paying Living Expenses: Not on file  Food Insecurity:    Worried About Programme researcher, broadcasting/film/video in the Last Year: Not on file   [employer] of Food in the Last Year: Not on file  Transportation Needs:    Lack of Transportation (Medical): Not on file   Lack of Transportation (Non-Medical): Not on file  Physical Activity:    Days of Exercise per Week: Not on file   Minutes of Exercise per Session: Not on file  Stress:    Feeling of Stress : Not on file  Social Connections:    Frequency of Communication with Friends and Family: Not on file   Frequency of Social Gatherings with Friends and Family: Not on file   Attends Religious Services: Not on file   Active Member of Clubs or Organizations: Not on file   Attends Banker Meetings: Not on file   Marital Status: Not on file  Intimate Partner Violence:    Fear of Current or Ex-Partner: Not on file   Emotionally Abused: Not on file   Physically Abused: Not on file   Sexually Abused: Not on file     No Known Allergies    There is no immunization history on file for this patient.  Outpatient Medications Prior to Visit  Medication Sig Dispense Refill   amoxicillin-clavulanate (AUGMENTIN) 875-125 MG tablet Take 1 tablet by mouth every 12 (twelve) hours. 14 tablet 0   fluticasone (FLONASE) 50 MCG/ACT nasal spray Place 2 sprays into both nostrils daily for 21 days. 9.9 mL 2   hydrOXYzine (ATARAX/VISTARIL) 25 MG tablet Take 1 tablet (25 mg total) by mouth every 6 (six) hours as needed. 60 tablet 0   naproxen sodium (ALEVE) 220 MG tablet Take 220 mg by mouth as needed.     pantoprazole (PROTONIX) 20 MG tablet TAKE 1 TABLET BY MOUTH EVERY DAY 30 tablet 1   predniSONE (DELTASONE) 50 MG tablet Take 1 tablet (50 mg total) by mouth daily. 5 tablet 0   No facility-administered medications  prior to visit.    Review of Systems  Constitutional: Negative for chills, fever and weight loss.  HENT: Positive for congestion.   Respiratory: Positive for cough and shortness of breath.   Cardiovascular: Positive for chest pain.  Gastrointestinal: Positive for abdominal pain and heartburn.       Difficulty swallowing  Neurological: Positive for headaches.  Psychiatric/Behavioral: Positive for depression. The patient is nervous/anxious.  Objective:   There were no vitals filed for this visit.   on   RA BMI Readings from Last 3 Encounters:  05/07/19 30.21 kg/m  04/29/19 30.68 kg/m  04/22/19 30.68 kg/m   Wt Readings from Last 3 Encounters:  05/07/19 216 lb 9.6 oz (98.2 kg)  04/29/19 220 lb (99.8 kg)  04/22/19 220 lb (99.8 kg)    Physical Exam   CBC    Component Value Date/Time   WBC 6.0 04/29/2019 2224   RBC 5.49 04/29/2019 2224   HGB 17.3 (H) 05/22/2019 1536   HCT 51.0 05/22/2019 1536   PLT 279 04/29/2019 2224   MCV 85.2 04/29/2019 2224   MCH 27.3 04/29/2019 2224   MCHC 32.1 04/29/2019 2224   RDW 12.8 04/29/2019 2224   LYMPHSABS 1.2 01/16/2019 1238   MONOABS 0.5 01/16/2019 1238   EOSABS 0.1 01/16/2019 1238   BASOSABS 0.0 01/16/2019 1238    CHEMISTRY No results for input(s): NA, K, CL, CO2, GLUCOSE, BUN, CREATININE, CALCIUM, MG, PHOS in the last 168 hours. CrCl cannot be calculated (Unknown ideal weight.).  SARS-CoV-2 negative (04/22/2019)  Chest Imaging- films reviewed: CXR, 2 view 04/29/2019-mildly increased interstitial markings.  No focal opacities.  CT abdomen pelvis 01/16/2019, lung images reviewed-normal lung bases.  Pulmonary Functions Testing Results: No flowsheet data found.      Assessment & Plan:   No diagnosis found.  GERD likely causing occasional dyspnea -Start pantoprazole once daily.  Recommended to take 30 minutes before breakfast. -Discussed lifestyle and dietary modifications necessary to control GERD.  He needs to focus  on stopping eating several hours before going to bed, especially avoiding fatty foods.  He should reduce his carbonated beverage intake. -Recommend against additional doses of steroids unless he is obviously wheezing on exam -PFTs -Avoid NSAIDs -Continue to avoid alcohol  History of tobacco abuse -Applauded his efforts at quitting smoking tobacco and marijuana.  Recommended ongoing cessation.  Allergic rhinosinusitis -Flonase 2 sprays bilaterally once daily  Recommend ongoing COVID-19 precautions-mask wearing, social distancing, handwashing.  Recommend receiving the vaccine once it is available to his age group.  RTC in 4 weeks.   Current Outpatient Medications:    amoxicillin-clavulanate (AUGMENTIN) 875-125 MG tablet, Take 1 tablet by mouth every 12 (twelve) hours., Disp: 14 tablet, Rfl: 0   fluticasone (FLONASE) 50 MCG/ACT nasal spray, Place 2 sprays into both nostrils daily for 21 days., Disp: 9.9 mL, Rfl: 2   hydrOXYzine (ATARAX/VISTARIL) 25 MG tablet, Take 1 tablet (25 mg total) by mouth every 6 (six) hours as needed., Disp: 60 tablet, Rfl: 0   naproxen sodium (ALEVE) 220 MG tablet, Take 220 mg by mouth as needed., Disp: , Rfl:    pantoprazole (PROTONIX) 20 MG tablet, TAKE 1 TABLET BY MOUTH EVERY DAY, Disp: 30 tablet, Rfl: 1   predniSONE (DELTASONE) 50 MG tablet, Take 1 tablet (50 mg total) by mouth daily., Disp: 5 tablet, Rfl: 0   I spent 55 minutes on this encounter, including face to face time and non-face to face time spent reviewing records, charting, coordinating care, etc.   Steffanie Dunn, DO [facility] 06/12/2019 7:12 AM

## 2019-06-13 ENCOUNTER — Other Ambulatory Visit: Payer: Self-pay | Admitting: Critical Care Medicine

## 2019-06-17 ENCOUNTER — Other Ambulatory Visit: Payer: Self-pay

## 2019-06-18 ENCOUNTER — Ambulatory Visit: Payer: BLUE CROSS/BLUE SHIELD | Admitting: Family Medicine

## 2019-06-18 DIAGNOSIS — Z0289 Encounter for other administrative examinations: Secondary | ICD-10-CM

## 2019-08-16 ENCOUNTER — Emergency Department (HOSPITAL_COMMUNITY)
Admission: EM | Admit: 2019-08-16 | Discharge: 2019-08-16 | Disposition: A | Discharge status: Home / Self Care | Payer: BLUE CROSS/BLUE SHIELD | Attending: Emergency Medicine | Admitting: Emergency Medicine

## 2019-08-16 ENCOUNTER — Other Ambulatory Visit: Payer: Self-pay

## 2019-08-16 DIAGNOSIS — J45909 Unspecified asthma, uncomplicated: Secondary | ICD-10-CM | POA: Diagnosis not present

## 2019-08-16 DIAGNOSIS — I119 Hypertensive heart disease without heart failure: Secondary | ICD-10-CM | POA: Diagnosis not present

## 2019-08-16 DIAGNOSIS — Z87891 Personal history of nicotine dependence: Secondary | ICD-10-CM | POA: Insufficient documentation

## 2019-08-16 DIAGNOSIS — R21 Rash and other nonspecific skin eruption: Secondary | ICD-10-CM | POA: Insufficient documentation

## 2019-08-16 MED ORDER — PREDNISONE 20 MG PO TABS: 40.0000 mg | ORAL_TABLET | Freq: Every day | ORAL | 0 refills | Status: AC

## 2019-08-16 MED ORDER — PERMETHRIN 5 % EX CREA: TOPICAL_CREAM | CUTANEOUS | 0 refills | Status: DC

## 2019-08-16 MED ORDER — FAMOTIDINE 40 MG PO TABS: 40.0000 mg | ORAL_TABLET | Freq: Every day | ORAL | 0 refills | Status: DC

## 2019-08-16 MED ORDER — DIPHENHYDRAMINE HCL 25 MG PO TABS: 25.0000 mg | ORAL_TABLET | Freq: Four times a day (QID) | ORAL | 0 refills | Status: DC | PRN

## 2019-08-16 NOTE — ED Notes (Signed)
Patient verbalizes understanding of discharge instructions. Opportunity for questioning and answers were provided. Armband removed by staff, pt discharged from ED ambulatory.   

## 2019-08-16 NOTE — Discharge Instructions (Addendum)
As discussed, I am going to treat you for scabies. You will need to use the cream once and then use it again 1-2 weeks later. Follow the instructions on the prescription. I am also sending you home with medications to help control symptoms. If your symptoms do not improve within the next week or two, please follow-up with PCP. I have included the number for [facility]. You were also tested for syphilis here. You will find out in a few days if that is positive. Return to the ER for new or worsening symptoms.
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
## 2019-08-16 NOTE — ED Triage Notes (Signed)
C/O itchy rash all over x 1 month

## 2019-08-17 LAB — RPR: RPR Ser Ql: NONREACTIVE

## 2019-08-23 ENCOUNTER — Other Ambulatory Visit: Payer: Self-pay

## 2019-08-23 ENCOUNTER — Emergency Department (HOSPITAL_COMMUNITY)
Admission: EM | Admit: 2019-08-23 | Discharge: 2019-08-23 | Disposition: A | Discharge status: Home / Self Care | Payer: BLUE CROSS/BLUE SHIELD | Attending: Emergency Medicine | Admitting: Emergency Medicine

## 2019-08-23 ENCOUNTER — Encounter (HOSPITAL_COMMUNITY): Payer: Self-pay | Admitting: Emergency Medicine

## 2019-08-23 DIAGNOSIS — R21 Rash and other nonspecific skin eruption: Secondary | ICD-10-CM | POA: Diagnosis not present

## 2019-08-23 DIAGNOSIS — F1210 Cannabis abuse, uncomplicated: Secondary | ICD-10-CM | POA: Insufficient documentation

## 2019-08-23 DIAGNOSIS — Z79899 Other long term (current) drug therapy: Secondary | ICD-10-CM | POA: Insufficient documentation

## 2019-08-23 DIAGNOSIS — Z87891 Personal history of nicotine dependence: Secondary | ICD-10-CM | POA: Diagnosis not present

## 2019-08-23 MED ORDER — TRIAMCINOLONE ACETONIDE 0.1 % EX CREA: 1.0000 "application " | TOPICAL_CREAM | Freq: Two times a day (BID) | CUTANEOUS | 0 refills | Status: DC | PRN

## 2019-08-23 MED ORDER — PERMETHRIN 5 % EX CREA: TOPICAL_CREAM | CUTANEOUS | 1 refills | Status: DC

## 2019-08-23 NOTE — Discharge Instructions (Signed)
Contact a health care provider if: You have itching that does not go away after 4 weeks of treatment. You continue to develop new bumps or burrows. You have redness, swelling, or pain in your rash area after treatment. You have fluid, blood, or pus coming from your rash. 

## 2019-08-23 NOTE — ED Provider Notes (Signed)
[facility] EMERGENCY DEPARTMENT Provider Note   CSN: [id number] Arrival date & time: 08/23/19  1015     History Chief Complaint  Patient presents with  . scabies    Todd Barajas is a 24 y.o. male.  Who presents emergency department for rash.  He was seen the other day and given treatment for scabies.  Patient's directions were unclear and he has been spot treating the rash with permethrin.  He states that he felt like it was getting better but ran out of the medication.  He complains of intensely pruritic rash over the chest and thorax.  He has not followed up with dermatology.  He denies any significant changes in the rash.  HPI     Past Medical History:  Diagnosis Date  . Asthma     There are no problems to display for this patient.   History reviewed. No pertinent surgical history.     Family History  Problem Relation Age of Onset  . Colon cancer Mother   . Asthma Father   . Hypertension Father   . Stroke Father   . Heart disease Father     Social History   Tobacco Use  . Smoking status: Former Smoker    Packs/day: 0.20    Years: 6.00    Pack years: 1.20    Types: Cigars    Start date: 2014    Quit date: 01/04/2019    Years since quitting: 0.6  . Smokeless tobacco: Never Used  Substance Use Topics  . Alcohol use: No  . Drug use: Not Currently    Types: Marijuana    Home Medications Prior to Admission medications   Medication Sig Start Date End Date Taking? Authorizing Provider  amoxicillin-clavulanate (AUGMENTIN) 875-125 MG tablet Take 1 tablet by mouth every 12 (twelve) hours. 05/22/19   Corena Herter, PA-C  diphenhydrAMINE (BENADRYL) 25 MG tablet Take 1 tablet (25 mg total) by mouth every 6 (six) hours as needed. 08/16/19   Suzy Bouchard, PA-C  famotidine (PEPCID) 40 MG tablet Take 1 tablet (40 mg total) by mouth daily. 08/16/19   Suzy Bouchard, PA-C  fluticasone (FLONASE) 50 MCG/ACT nasal spray Place 2 sprays into  both nostrils daily for 21 days. 05/22/19 06/12/19  Corena Herter, PA-C  hydrOXYzine (ATARAX/VISTARIL) 25 MG tablet Take 1 tablet (25 mg total) by mouth every 6 (six) hours as needed. 06/02/23   Delora Fuel, MD  naproxen sodium (ALEVE) 220 MG tablet Take 220 mg by mouth as needed.    [provider]  pantoprazole (PROTONIX) 20 MG tablet TAKE 1 TABLET BY MOUTH EVERY DAY 06/17/19   Julian Hy, DO  permethrin (ELIMITE) 5 % cream Apply to entire body other than face - let sit for 14 hours then wash off, may repeat in 1 week if still having symptoms 08/23/19   Margarita Mail, PA-C  predniSONE (DELTASONE) 50 MG tablet Take 1 tablet (50 mg total) by mouth daily. 06/29/62   Delora Fuel, MD  triamcinolone cream (KENALOG) 0.1 % Apply 1 application topically 2 (two) times daily as needed. 08/23/19   Margarita Mail, PA-C    Allergies    Patient has no known allergies.  Review of Systems   Review of Systems   Physical Exam Updated Vital Signs BP 134/78 (BP Location: Right Arm)   Pulse (!) 50   Temp 97.7 F (36.5 C) (Oral)   Resp 16   SpO2 100%   Physical Exam  Vitals and nursing note reviewed.  Constitutional:      General: He is not in acute distress.    Appearance: He is well-developed. He is not diaphoretic.  HENT:     Head: Normocephalic and atraumatic.  Eyes:     General: No scleral icterus.    Conjunctiva/sclera: Conjunctivae normal.  Cardiovascular:     Rate and Rhythm: Normal rate and regular rhythm.     Heart sounds: Normal heart sounds.  Pulmonary:     Effort: Pulmonary effort is normal. No respiratory distress.     Breath sounds: Normal breath sounds.  Abdominal:     Palpations: Abdomen is soft.     Tenderness: There is no abdominal tenderness.  Musculoskeletal:     Cervical back: Normal range of motion and neck supple.  Skin:    General: Skin is warm and dry.     Findings: Rash present. Rash is papular and scaling.  Neurological:     Mental Status: He is  alert.  Psychiatric:        Behavior: Behavior normal.     ED Results / Procedures / Treatments   Labs (all labs ordered are listed, but only abnormal results are displayed) Labs Reviewed - No data to display  EKG None  Radiology No results found.  Procedures Procedures (including critical care time)  Medications Ordered in ED Medications - No data to display  ED Course  I have reviewed the triage vital signs and the nursing notes.  Pertinent labs & imaging results that were available during my care of the patient were reviewed by me and considered in my medical decision making (see chart for details).    MDM Rules/Calculators/A&P                      Patient with persistent rash with concern for potential scabies.  I have advised the patient of proper use of permethrin cream.  He is to apply the medication from the neck down and in all crevices of his body for 8 to 10 hours overnight and then wash off once.  I have also supplied him with Kenalog for intense pruritus patient is advised to follow closely with dermatology.  Have given him the names of several in the community.  He appears otherwise appropriate for discharge with home supportive care precautions given. Final Clinical Impression(s) / ED Diagnoses Final diagnoses:  None    Rx / DC Orders ED Discharge Orders         Ordered    permethrin (ELIMITE) 5 % cream     08/23/19 1153    triamcinolone cream (KENALOG) 0.1 %  2 times daily PRN     08/23/19 1153           Arthor Captain, PA-C 08/23/19 1558    Melene Plan, DO 08/23/19 [phone]

## 2019-08-23 NOTE — ED Triage Notes (Signed)
Reports diagnosed with scabies last week, used cream but felt like he needed to retreat yesterday and did not have enough medication to do so.

## 2019-10-08 ENCOUNTER — Encounter (HOSPITAL_COMMUNITY): Payer: Self-pay | Admitting: Emergency Medicine

## 2019-10-08 ENCOUNTER — Other Ambulatory Visit: Payer: Self-pay

## 2019-10-08 ENCOUNTER — Emergency Department (HOSPITAL_COMMUNITY)
Admission: EM | Admit: 2019-10-08 | Discharge: 2019-10-09 | Disposition: A | Discharge status: Home / Self Care | Payer: BLUE CROSS/BLUE SHIELD | Attending: Emergency Medicine | Admitting: Emergency Medicine

## 2019-10-08 DIAGNOSIS — L299 Pruritus, unspecified: Secondary | ICD-10-CM | POA: Insufficient documentation

## 2019-10-08 DIAGNOSIS — Z5321 Procedure and treatment not carried out due to patient leaving prior to being seen by health care provider: Secondary | ICD-10-CM | POA: Diagnosis not present

## 2019-10-08 DIAGNOSIS — R21 Rash and other nonspecific skin eruption: Secondary | ICD-10-CM | POA: Insufficient documentation

## 2019-10-08 NOTE — ED Triage Notes (Signed)
Patient reports generalized itchy skin rashes onset last week , no SOB /no oral swelling , respirations unlabored /denies fever .

## 2019-10-09 NOTE — ED Notes (Signed)
Pt states that they were leaving

## 2019-10-14 ENCOUNTER — Encounter (HOSPITAL_COMMUNITY): Payer: Self-pay | Admitting: Emergency Medicine

## 2019-10-14 ENCOUNTER — Emergency Department (HOSPITAL_COMMUNITY)
Admission: EM | Admit: 2019-10-14 | Discharge: 2019-10-14 | Disposition: A | Discharge status: Home / Self Care | Payer: BLUE CROSS/BLUE SHIELD | Attending: Emergency Medicine | Admitting: Emergency Medicine

## 2019-10-14 DIAGNOSIS — R21 Rash and other nonspecific skin eruption: Secondary | ICD-10-CM | POA: Diagnosis not present

## 2019-10-14 DIAGNOSIS — Z87891 Personal history of nicotine dependence: Secondary | ICD-10-CM | POA: Insufficient documentation

## 2019-10-14 DIAGNOSIS — Z79899 Other long term (current) drug therapy: Secondary | ICD-10-CM | POA: Diagnosis not present

## 2019-10-14 DIAGNOSIS — L299 Pruritus, unspecified: Secondary | ICD-10-CM | POA: Diagnosis not present

## 2019-10-14 MED ORDER — TRIAMCINOLONE ACETONIDE 0.1 % EX CREA: 1.0000 "application " | TOPICAL_CREAM | Freq: Two times a day (BID) | CUTANEOUS | 0 refills | Status: DC

## 2019-10-14 MED ORDER — PERMETHRIN 5 % EX CREA: TOPICAL_CREAM | CUTANEOUS | 0 refills | Status: DC

## 2019-10-14 NOTE — ED Provider Notes (Signed)
[facility] EMERGENCY DEPARTMENT Provider Note   CSN: [id number] Arrival date & time: 10/14/19  7902     History Chief Complaint  Patient presents with  . Rash    Todd Barajas is a 24 y.o. male.  HPI   24 y/o M with a h/o asthma who presents to the ED today for eval of a rash. States he has had the rash for several months.  Rashes located to the bilateral arms, trunk and legs.  Rash consists of red bumps that are pruritic.  States he was seen in the ED a few weeks ago and given permethrin cream to treat scabies.  This improved his rash.  He later followed up with dermatology and had his skin biopsied and was told that it was not scabies and was given triamcinolone cream.  States that he used this cream and ran out of it but it did not help when he used it and he is here for more permethrin.  Past Medical History:  Diagnosis Date  . Asthma     There are no problems to display for this patient.   History reviewed. No pertinent surgical history.     Family History  Problem Relation Age of Onset  . Colon cancer Mother   . Asthma Father   . Hypertension Father   . Stroke Father   . Heart disease Father     Social History   Tobacco Use  . Smoking status: Former Smoker    Packs/day: 0.20    Years: 6.00    Pack years: 1.20    Types: Cigars    Start date: 2014    Quit date: 01/04/2019    Years since quitting: 0.7  . Smokeless tobacco: Never Used  Vaping Use  . Vaping Use: Never used  Substance Use Topics  . Alcohol use: No  . Drug use: Not Currently    Types: Marijuana    Home Medications Prior to Admission medications   Medication Sig Start Date End Date Taking? Authorizing Provider  amoxicillin-clavulanate (AUGMENTIN) 875-125 MG tablet Take 1 tablet by mouth every 12 (twelve) hours. 05/22/19   Corena Herter, PA-C  diphenhydrAMINE (BENADRYL) 25 MG tablet Take 1 tablet (25 mg total) by mouth every 6 (six) hours as needed. 08/16/19   Suzy Bouchard, PA-C  famotidine (PEPCID) 40 MG tablet Take 1 tablet (40 mg total) by mouth daily. 08/16/19   Suzy Bouchard, PA-C  fluticasone (FLONASE) 50 MCG/ACT nasal spray Place 2 sprays into both nostrils daily for 21 days. 05/22/19 06/12/19  Corena Herter, PA-C  hydrOXYzine (ATARAX/VISTARIL) 25 MG tablet Take 1 tablet (25 mg total) by mouth every 6 (six) hours as needed. 4/0/97   Delora Fuel, MD  naproxen sodium (ALEVE) 220 MG tablet Take 220 mg by mouth as needed.    [provider]  pantoprazole (PROTONIX) 20 MG tablet TAKE 1 TABLET BY MOUTH EVERY DAY 06/17/19   Julian Hy, DO  permethrin (ELIMITE) 5 % cream Apply to affected area once 10/14/19   Emelie Newsom S, PA-C  predniSONE (DELTASONE) 50 MG tablet Take 1 tablet (50 mg total) by mouth daily. 06/28/30   Delora Fuel, MD  triamcinolone cream (KENALOG) 0.1 % Apply 1 application topically 2 (two) times daily. 10/14/19   Markee Remlinger S, PA-C    Allergies    Patient has no known allergies.  Review of Systems   Review of Systems  Constitutional: Negative for fever.  Skin:  Positive for rash.    Physical Exam Updated Vital Signs BP 119/81 (BP Location: Right Arm)   Pulse (!) 47   Temp 97.8 F (36.6 C) (Oral)   Resp 18   Ht 5\' 11"  (1.803 m)   Wt 97.5 kg   SpO2 99%   BMI 29.99 kg/m   Physical Exam Constitutional:      General: He is not in acute distress.    Appearance: He is well-developed.  Eyes:     Conjunctiva/sclera: Conjunctivae normal.  Cardiovascular:     Rate and Rhythm: Normal rate.  Pulmonary:     Effort: Pulmonary effort is normal.  Skin:    General: Skin is warm and dry.     Findings: Rash present.     Comments: erythematous papules to the bilat arms and trunk  Neurological:     Mental Status: He is alert and oriented to person, place, and time.     ED Results / Procedures / Treatments   Labs (all labs ordered are listed, but only abnormal results are displayed) Labs Reviewed - No  data to display  EKG None  Radiology No results found.  Procedures Procedures (including critical care time)  Medications Ordered in ED Medications - No data to display  ED Course  I have reviewed the triage vital signs and the nursing notes.  Pertinent labs & imaging results that were available during my care of the patient were reviewed by me and considered in my medical decision making (see chart for details).    MDM Rules/Calculators/A&P                          Erythematous papular rash ongoing for months. Dx with scabies and given permethrin initially which helped. Later f/u with derm and told this was not scabies. Given rx for triamcinolone which he states didn't help.    Reviewed prior visits.  Had RPR testing 08/16/2019 which was negative.  Patient denies any difficulty breathing or swallowing.  Pt has a patent airway without stridor and is handling secretions without difficulty; no angioedema. No blisters, no pustules, no warmth, no draining sinus tracts, no superficial abscesses, no bullous impetigo, no vesicles, no desquamation, no target lesions with dusky purpura or a central bulla. Not tender to touch. No concern for superimposed infection. No concern for SJS, TEN, TSS, tick borne illness, syphilis or other life-threatening condition. Will discharge home with refill of permethrin but will also refill his triamcinolone cream.  Advised that he will need to follow-up with dermatology again and return to ED for new or worsening symptoms.  He voiced understanding plan reasons to return.  Questions answered.  Patient able for discharge.  Final Clinical Impression(s) / ED Diagnoses Final diagnoses:  Rash    Rx / DC Orders ED Discharge Orders         Ordered    triamcinolone cream (KENALOG) 0.1 %  2 times daily     Discontinue  Reprint     10/14/19 1407    permethrin (ELIMITE) 5 % cream     Discontinue  Reprint     10/14/19 [address], Kizzy Olafson S,  PA-C 10/14/19 1407    [place], 10/16/19, MD 10/14/19 [phone]

## 2019-10-14 NOTE — ED Notes (Signed)
Pt reports he saw derm and they said it did not come back scabies but feels promethium cream was most helpful.

## 2019-10-14 NOTE — Discharge Instructions (Signed)
Follow up with your dermatologist  Please return to the emergency department for any new or worsening symptoms.

## 2019-10-14 NOTE — ED Triage Notes (Signed)
Pt. Stated, I have rash today and its the same as 4 months ago.

## 2019-11-04 ENCOUNTER — Other Ambulatory Visit: Payer: Self-pay

## 2019-11-04 ENCOUNTER — Encounter (HOSPITAL_COMMUNITY): Payer: Self-pay

## 2019-11-04 ENCOUNTER — Emergency Department (HOSPITAL_COMMUNITY)
Admission: EM | Admit: 2019-11-04 | Discharge: 2019-11-04 | Disposition: A | Discharge status: Home / Self Care | Payer: BLUE CROSS/BLUE SHIELD | Attending: Emergency Medicine | Admitting: Emergency Medicine

## 2019-11-04 DIAGNOSIS — Z79899 Other long term (current) drug therapy: Secondary | ICD-10-CM | POA: Diagnosis not present

## 2019-11-04 DIAGNOSIS — R21 Rash and other nonspecific skin eruption: Secondary | ICD-10-CM | POA: Insufficient documentation

## 2019-11-04 DIAGNOSIS — Z87891 Personal history of nicotine dependence: Secondary | ICD-10-CM | POA: Insufficient documentation

## 2019-11-04 DIAGNOSIS — J45909 Unspecified asthma, uncomplicated: Secondary | ICD-10-CM | POA: Diagnosis not present

## 2019-11-04 MED ORDER — PERMETHRIN 5 % EX CREA: TOPICAL_CREAM | CUTANEOUS | 0 refills | Status: DC

## 2019-11-04 NOTE — ED Triage Notes (Signed)
Pt presents to ED with rash x5 months, seen here several times for the same, reports his partner has the same "bites" pt f/u with dermatologist with no relief. Pt requesting another f/u referral and some cream. States the dermatologist said it was not scabies, advised him to change soaps/laundry detergent, pt still with no relief after changing hygiene products

## 2019-11-04 NOTE — ED Notes (Signed)
Patient verbalizes understanding of discharge instructions. Opportunity for questioning and answers were provided. Armband removed by staff, pt discharged from ED.  

## 2019-11-04 NOTE — Discharge Instructions (Signed)
Use the permethrin as directed. It is important for you to follow-up with your dermatologist. Return to the ER for worsening rash, lip or tongue swelling, shortness of breath.

## 2019-11-04 NOTE — ED Provider Notes (Signed)
[facility] EMERGENCY DEPARTMENT Provider Note   CSN: [id number] Arrival date & time: 11/04/19  1137     History Chief Complaint  Patient presents with  . Rash    Todd Barajas is a 24 y.o. male who presents to ED with a chief complaint of rash.  Reports intermittent pruritic rash to his torso and extremities for the past several months.  He has been treated with Kenalog, permethrin in the past.  States that the permethrin has helped him.  He did follow-up with dermatology last month and his lesions were biopsied.  He was told that it was not due to scabies.  He does not remember what his diagnosis was.  However he states that the permethrin has helped him regardless.  He was told to follow-up with the dermatologist but did not do so because "did not call me about it."  He denies any new soaps, lotions, detergents or other environmental triggers.  He denies any lip swelling, tongue swelling, trouble breathing or sick contacts with similar symptoms.  HPI     Past Medical History:  Diagnosis Date  . Asthma     There are no problems to display for this patient.   History reviewed. No pertinent surgical history.     Family History  Problem Relation Age of Onset  . Colon cancer Mother   . Asthma Father   . Hypertension Father   . Stroke Father   . Heart disease Father     Social History   Tobacco Use  . Smoking status: Former Smoker    Packs/day: 0.20    Years: 6.00    Pack years: 1.20    Types: Cigars    Start date: 2014    Quit date: 01/04/2019    Years since quitting: 0.8  . Smokeless tobacco: Never Used  Vaping Use  . Vaping Use: Never used  Substance Use Topics  . Alcohol use: No  . Drug use: Not Currently    Types: Marijuana    Home Medications Prior to Admission medications   Medication Sig Start Date End Date Taking? Authorizing Provider  amoxicillin-clavulanate (AUGMENTIN) 875-125 MG tablet Take 1 tablet by mouth every 12 (twelve)  hours. 05/22/19   Lorelee New, PA-C  diphenhydrAMINE (BENADRYL) 25 MG tablet Take 1 tablet (25 mg total) by mouth every 6 (six) hours as needed. 08/16/19   Mannie Stabile, PA-C  famotidine (PEPCID) 40 MG tablet Take 1 tablet (40 mg total) by mouth daily. 08/16/19   Mannie Stabile, PA-C  fluticasone (FLONASE) 50 MCG/ACT nasal spray Place 2 sprays into both nostrils daily for 21 days. 05/22/19 06/12/19  Lorelee New, PA-C  hydrOXYzine (ATARAX/VISTARIL) 25 MG tablet Take 1 tablet (25 mg total) by mouth every 6 (six) hours as needed. 04/30/19   Dione Booze, MD  naproxen sodium (ALEVE) 220 MG tablet Take 220 mg by mouth as needed.    [provider]  pantoprazole (PROTONIX) 20 MG tablet TAKE 1 TABLET BY MOUTH EVERY DAY 06/17/19   Steffanie Dunn, DO  permethrin (ELIMITE) 5 % cream Apply to affected area once 11/04/19   Verdis Koval, PA-C  predniSONE (DELTASONE) 50 MG tablet Take 1 tablet (50 mg total) by mouth daily. 04/30/19   Dione Booze, MD  triamcinolone cream (KENALOG) 0.1 % Apply 1 application topically 2 (two) times daily. 10/14/19   Couture, Cortni S, PA-C    Allergies    Patient has no known allergies.  Review  of Systems   Review of Systems  Constitutional: Negative for chills and fever.  HENT: Negative for facial swelling.   Gastrointestinal: Negative for nausea and vomiting.  Skin: Positive for rash.    Physical Exam Updated Vital Signs BP 137/80 (BP Location: Left Arm)   Pulse (!) 52   Temp 98.3 F (36.8 C) (Oral)   Resp 14   SpO2 100%   Physical Exam Vitals and nursing note reviewed.  Constitutional:      General: He is not in acute distress.    Appearance: He is well-developed. He is not diaphoretic.     Comments: No angioedema, anaphylaxis.  Speaking complete sentences without difficulty.  HENT:     Head: Normocephalic and atraumatic.  Eyes:     General: No scleral icterus.       Right eye: No discharge.        Left eye: No discharge.      Conjunctiva/sclera: Conjunctivae normal.  Pulmonary:     Effort: Pulmonary effort is normal. No respiratory distress.  Musculoskeletal:     Cervical back: Normal range of motion.  Skin:    Findings: Rash present.     Comments: Erythematous, papular rash to torso.  Neurological:     Mental Status: He is alert.     ED Results / Procedures / Treatments   Labs (all labs ordered are listed, but only abnormal results are displayed) Labs Reviewed - No data to display  EKG None  Radiology No results found.  Procedures Procedures (including critical care time)  Medications Ordered in ED Medications - No data to display  ED Course  I have reviewed the triage vital signs and the nursing notes.  Pertinent labs & imaging results that were available during my care of the patient were reviewed by me and considered in my medical decision making (see chart for details).    MDM Rules/Calculators/A&P                          24 year old male presenting to the ED with a chief complaint of rash.  Symptoms have been going on for the past several months he did follow-up with dermatology and biopsy was done.  He is not his diagnosis but he was told that it was not due to scabies.  However he states that permethrin has helped him more than any other treatment.  On exam there is a pruritic, erythematous rash to the torso with some old healing scabs noted.  Pt has a patent airway without stridor and is handling secretions without difficulty; no angioedema. No blisters, no pustules, no warmth, no draining sinus tracts, no superficial abscesses, no bullous impetigo, no vesicles, no desquamation, no target lesions with dusky purpura or a central bulla. Not tender to touch. No concern for superimposed infection. No concern for SJS, TEN, TSS, tick borne illness, syphilis or other life-threatening condition.  Patient would like [place] to help get a follow-up appointment at his dermatologist.  Told patient that  because he is already established patient he will just need to call her to make a follow-up appointment.  In the meantime he is requesting another course of permethrin.  I have offered other steroid creams but he declines.  We will have him return for worsening symptoms.  Evaluation does not show pathology that would require ongoing emergent intervention or inpatient treatment. I explained the diagnosis to the patient. Pain has been managed and has no complaints prior  to discharge. Patient is comfortable with above plan and is stable for discharge at this time. All questions were answered prior to disposition. Strict return precautions for returning to the ED were discussed. Encouraged follow up with PCP.   An After Visit Summary was printed and given to the patient.   Portions of this note were generated with Scientist, clinical (histocompatibility and immunogenetics). Dictation errors may occur despite best attempts at proofreading.  Final Clinical Impression(s) / ED Diagnoses Final diagnoses:  Rash and nonspecific skin eruption    Rx / DC Orders ED Discharge Orders         Ordered    permethrin (ELIMITE) 5 % cream     Discontinue  Reprint     11/04/19 1318           Dietrich Pates, PA-C 11/04/19 1336    Tegeler, Canary Brim, MD 11/04/19 1442

## 2019-11-13 ENCOUNTER — Encounter (HOSPITAL_COMMUNITY): Payer: Self-pay | Admitting: *Deleted

## 2019-11-13 ENCOUNTER — Emergency Department (HOSPITAL_COMMUNITY)
Admission: EM | Admit: 2019-11-13 | Discharge: 2019-11-13 | Disposition: A | Discharge status: Home / Self Care | Payer: BLUE CROSS/BLUE SHIELD | Attending: Emergency Medicine | Admitting: Emergency Medicine

## 2019-11-13 ENCOUNTER — Other Ambulatory Visit: Payer: Self-pay

## 2019-11-13 DIAGNOSIS — R21 Rash and other nonspecific skin eruption: Secondary | ICD-10-CM | POA: Diagnosis not present

## 2019-11-13 DIAGNOSIS — R070 Pain in throat: Secondary | ICD-10-CM | POA: Diagnosis not present

## 2019-11-13 DIAGNOSIS — Z5321 Procedure and treatment not carried out due to patient leaving prior to being seen by health care provider: Secondary | ICD-10-CM | POA: Diagnosis not present

## 2019-11-13 LAB — GROUP A STREP BY PCR: Group A Strep by PCR: NOT DETECTED

## 2019-11-13 NOTE — ED Triage Notes (Signed)
The pt is c/o a sore throat for 2 days and he also wants to be seen for a rash that he has had for 5-6 months   No distress

## 2019-11-13 NOTE — ED Notes (Signed)
Pt stated his decision to leave [facility] ED, saying that he will return and check in again with registration in the morning due to the anticipated wait time before seeing a provider. This NT encouraged pt to stay, reminded him that his wait time would restart if he chose to leave and later return, and informed him that a shorter wait time is not guaranteed in the morning. Pt acknowledged this and left the facility.

## 2020-01-20 ENCOUNTER — Other Ambulatory Visit: Payer: Self-pay

## 2020-01-20 ENCOUNTER — Emergency Department (HOSPITAL_COMMUNITY)
Admission: EM | Admit: 2020-01-20 | Discharge: 2020-01-20 | Disposition: A | Discharge status: Home / Self Care | Payer: BLUE CROSS/BLUE SHIELD | Attending: Emergency Medicine | Admitting: Emergency Medicine

## 2020-01-20 ENCOUNTER — Encounter (HOSPITAL_COMMUNITY): Payer: Self-pay | Admitting: Emergency Medicine

## 2020-01-20 DIAGNOSIS — Z87891 Personal history of nicotine dependence: Secondary | ICD-10-CM | POA: Insufficient documentation

## 2020-01-20 DIAGNOSIS — J069 Acute upper respiratory infection, unspecified: Secondary | ICD-10-CM | POA: Insufficient documentation

## 2020-01-20 DIAGNOSIS — Z20822 Contact with and (suspected) exposure to covid-19: Secondary | ICD-10-CM | POA: Diagnosis not present

## 2020-01-20 DIAGNOSIS — R05 Cough: Secondary | ICD-10-CM | POA: Diagnosis present

## 2020-01-20 DIAGNOSIS — J45909 Unspecified asthma, uncomplicated: Secondary | ICD-10-CM | POA: Insufficient documentation

## 2020-01-20 LAB — RESPIRATORY PANEL BY RT PCR (FLU A&B, COVID)
Influenza A by PCR: NEGATIVE
Influenza B by PCR: NEGATIVE
SARS Coronavirus 2 by RT PCR: NEGATIVE

## 2020-01-20 NOTE — ED Triage Notes (Signed)
Pt here from school wanting a covid trest , states that his teacher had covid last week , pt only complaint is some nasal congestion

## 2020-01-20 NOTE — Discharge Instructions (Signed)
Return if any problems.

## 2020-01-20 NOTE — ED Provider Notes (Signed)
[facility] EMERGENCY DEPARTMENT Provider Note   CSN: [id number] Arrival date & time: 01/20/20  1252     History No chief complaint on file.   Todd Barajas is a 24 y.o. male.  Pt requesting covid test.  Pt was exposed at school  The history is provided by the patient. No language interpreter was used.  Cough Cough characteristics:  Non-productive Sputum characteristics:  Nondescript Severity:  Mild Duration:  2 days Timing:  Constant Progression:  Unchanged Chronicity:  New Risk factors: no recent infection        Past Medical History:  Diagnosis Date  . Asthma     There are no problems to display for this patient.   History reviewed. No pertinent surgical history.     Family History  Problem Relation Age of Onset  . Colon cancer Mother   . Asthma Father   . Hypertension Father   . Stroke Father   . Heart disease Father     Social History   Tobacco Use  . Smoking status: Former Smoker    Packs/day: 0.20    Years: 6.00    Pack years: 1.20    Types: Cigars    Start date: 2014    Quit date: 01/04/2019    Years since quitting: 1.0  . Smokeless tobacco: Never Used  Vaping Use  . Vaping Use: Never used  Substance Use Topics  . Alcohol use: No  . Drug use: Not Currently    Types: Marijuana    Home Medications Prior to Admission medications   Medication Sig Start Date End Date Taking? Authorizing Provider  amoxicillin-clavulanate (AUGMENTIN) 875-125 MG tablet Take 1 tablet by mouth every 12 (twelve) hours. 05/22/19   Lorelee New, PA-C  diphenhydrAMINE (BENADRYL) 25 MG tablet Take 1 tablet (25 mg total) by mouth every 6 (six) hours as needed. 08/16/19   Mannie Stabile, PA-C  famotidine (PEPCID) 40 MG tablet Take 1 tablet (40 mg total) by mouth daily. 08/16/19   Mannie Stabile, PA-C  fluticasone (FLONASE) 50 MCG/ACT nasal spray Place 2 sprays into both nostrils daily for 21 days. 05/22/19 06/12/19  Lorelee New, PA-C    hydrOXYzine (ATARAX/VISTARIL) 25 MG tablet Take 1 tablet (25 mg total) by mouth every 6 (six) hours as needed. 04/30/19   Dione Booze, MD  naproxen sodium (ALEVE) 220 MG tablet Take 220 mg by mouth as needed.    [provider]  pantoprazole (PROTONIX) 20 MG tablet TAKE 1 TABLET BY MOUTH EVERY DAY 06/17/19   Steffanie Dunn, DO  permethrin (ELIMITE) 5 % cream Apply to affected area once 11/04/19   Khatri, Hina, PA-C  predniSONE (DELTASONE) 50 MG tablet Take 1 tablet (50 mg total) by mouth daily. 04/30/19   Dione Booze, MD  triamcinolone cream (KENALOG) 0.1 % Apply 1 application topically 2 (two) times daily. 10/14/19   Couture, Cortni S, PA-C    Allergies    Patient has no known allergies.  Review of Systems   Review of Systems  Respiratory: Positive for cough.   All other systems reviewed and are negative.   Physical Exam Updated Vital Signs BP 119/66 (BP Location: Right Arm)   Pulse 60   Temp 98.6 F (37 C) (Oral)   Resp 18   Ht 5\' 11"  (1.803 m)   Wt 95.3 kg   SpO2 100%   BMI 29.29 kg/m   Physical Exam Vitals and nursing note reviewed.  Constitutional:  Appearance: He is well-developed.  HENT:     Head: Normocephalic and atraumatic.  Eyes:     Conjunctiva/sclera: Conjunctivae normal.  Cardiovascular:     Rate and Rhythm: Normal rate and regular rhythm.     Heart sounds: No murmur heard.   Pulmonary:     Effort: Pulmonary effort is normal. No respiratory distress.     Breath sounds: Normal breath sounds.  Musculoskeletal:     Cervical back: Neck supple.  Skin:    General: Skin is warm and dry.  Neurological:     Mental Status: He is alert.     ED Results / Procedures / Treatments   Labs (all labs ordered are listed, but only abnormal results are displayed) Labs Reviewed  RESPIRATORY PANEL BY RT PCR (FLU A&B, COVID)    EKG None  Radiology No results found.  Procedures Procedures (including critical care time)  Medications Ordered in  ED Medications - No data to display  ED Course  I have reviewed the triage vital signs and the nursing notes.  Pertinent labs & imaging results that were available during my care of the patient were reviewed by me and considered in my medical decision making (see chart for details).    MDM Rules/Calculators/A&P                          MDM:  covid and influenza are negative.  Pt advised symptomatic care. Final Clinical Impression(s) / ED Diagnoses Final diagnoses:  Upper respiratory tract infection, unspecified type    Rx / DC Orders ED Discharge Orders    None    An After Visit Summary was printed and given to the patient.    Osie Cheeks 01/20/20 1717    Benjiman Core, MD 01/20/20 [phone]

## 2020-02-04 ENCOUNTER — Ambulatory Visit: Payer: BLUE CROSS/BLUE SHIELD

## 2020-02-04 ENCOUNTER — Ambulatory Visit: Payer: BLUE CROSS/BLUE SHIELD | Attending: Internal Medicine

## 2020-02-04 DIAGNOSIS — Z23 Encounter for immunization: Secondary | ICD-10-CM

## 2020-02-04 NOTE — Progress Notes (Signed)
° °  Covid-19 Vaccination Clinic  Name:  Todd Barajas    MRN: [id number] DOB: 03-26-1996  02/04/2020  [name] was observed post Covid-19 immunization for 15 minutes without incident. He was provided with Vaccine Information Sheet and instruction to access the V-Safe system.   [name] was instructed to call 911 with any severe reactions post vaccine:  Difficulty breathing   Swelling of face and throat   A fast heartbeat   A bad rash all over body   Dizziness and weakness   Immunizations Administered    Name Date Dose VIS Date Route   Pfizer COVID-19 Vaccine 02/04/2020  9:55 AM 0.3 mL 06/19/2018 Intramuscular   Manufacturer: [employer], [employer]   Lot: [id number]   NDC: [address]-4

## 2020-02-21 ENCOUNTER — Emergency Department (HOSPITAL_COMMUNITY)
Admission: EM | Admit: 2020-02-21 | Discharge: 2020-02-21 | Disposition: A | Discharge status: Home / Self Care | Payer: BLUE CROSS/BLUE SHIELD | Attending: Emergency Medicine | Admitting: Emergency Medicine

## 2020-02-21 ENCOUNTER — Other Ambulatory Visit: Payer: Self-pay

## 2020-02-21 DIAGNOSIS — Z5321 Procedure and treatment not carried out due to patient leaving prior to being seen by health care provider: Secondary | ICD-10-CM | POA: Diagnosis not present

## 2020-02-21 DIAGNOSIS — Z20822 Contact with and (suspected) exposure to covid-19: Secondary | ICD-10-CM | POA: Insufficient documentation

## 2020-02-21 LAB — RESPIRATORY PANEL BY RT PCR (FLU A&B, COVID)
Influenza A by PCR: NEGATIVE
Influenza B by PCR: NEGATIVE
SARS Coronavirus 2 by RT PCR: NEGATIVE

## 2020-02-21 NOTE — ED Triage Notes (Signed)
Pt requesting covid test for a game

## 2020-02-25 ENCOUNTER — Ambulatory Visit: Payer: BLUE CROSS/BLUE SHIELD

## 2020-03-05 ENCOUNTER — Emergency Department (HOSPITAL_COMMUNITY)
Admission: EM | Admit: 2020-03-05 | Discharge: 2020-03-05 | Disposition: A | Discharge status: Home / Self Care | Payer: BLUE CROSS/BLUE SHIELD | Attending: Emergency Medicine | Admitting: Emergency Medicine

## 2020-03-05 ENCOUNTER — Encounter (HOSPITAL_COMMUNITY): Payer: Self-pay

## 2020-03-05 DIAGNOSIS — Z87891 Personal history of nicotine dependence: Secondary | ICD-10-CM | POA: Diagnosis not present

## 2020-03-05 DIAGNOSIS — R0981 Nasal congestion: Secondary | ICD-10-CM

## 2020-03-05 DIAGNOSIS — Z20822 Contact with and (suspected) exposure to covid-19: Secondary | ICD-10-CM | POA: Insufficient documentation

## 2020-03-05 DIAGNOSIS — J45909 Unspecified asthma, uncomplicated: Secondary | ICD-10-CM | POA: Insufficient documentation

## 2020-03-05 LAB — RESPIRATORY PANEL BY RT PCR (FLU A&B, COVID)
Influenza A by PCR: NEGATIVE
Influenza B by PCR: NEGATIVE
SARS Coronavirus 2 by RT PCR: NEGATIVE

## 2020-03-05 NOTE — ED Triage Notes (Signed)
Pt arrives to ED w/ c/o nasal congestion x 1 week. Pt endorses productive cough in the morning. Pt denies fever, sob.

## 2020-03-05 NOTE — Discharge Instructions (Addendum)
Your symptoms are likely caused by allergic rhinitis versus a viral upper respiratory infection. Antibiotics are not helpful in treating viral infection, the virus should run its course in about 5-7 days, allergic rhinitis can continue throughout allergy season. Please make sure you are drinking plenty of fluids.  Use Zyrtec and Flonase daily to help with nasal congestion.  If you develop sore throat, body aches, you can treat your symptoms supportively with tylenol/ibuprofen for fevers and pains,and over the counter cough syrups and throat lozenges to help with cough. If your symptoms are not improving please follow up with you Primary doctor.   If you develop persistent fevers, shortness of breath or difficulty breathing, chest pain, severe headache and neck pain, persistent nausea and vomiting or other new or concerning symptoms return to the Emergency department.  You have a Covid test pending and you should quarantine at home until you receive your results, these should be back by tomorrow, you will receive a phone call if results are positive, you can view negative results online through MyChart.  Please follow the instructions on your paperwork today to set up your MyChart account.

## 2020-03-05 NOTE — ED Notes (Signed)
Pt ambulated to treatment room without difficulty

## 2020-03-05 NOTE — ED Provider Notes (Signed)
[facility] EMERGENCY DEPARTMENT Provider Note   CSN: [id number] Arrival date & time: 03/05/20  1418     History Chief Complaint  Patient presents with  . Nasal Congestion    Todd Barajas is a 24 y.o. male.  Todd Barajas is a 24 y.o. male with a history of asthma, who presents to the ED for a week of nasal congestion and rhinorrhea.  Patient states symptoms have been fairly constant and not improving.  He states clear nasal drainage that is worse in the morning.  He states he is only had a cough in the morning when he first wakes up and that seems to improve.  Thinks it may be from stuff running down his throat.  He states he has had to clear his throat frequently.  Denies any ear pain or discharge.  No fevers or chills.  No chest pain or shortness of breath.  No abdominal pain, nausea, vomiting or diarrhea.  No body aches.  Patient states he got his first Covid vaccine, has not gotten his second one yet because he was concerned about the symptoms so held off.  States he had similar symptoms a few week ago and was treated with a Z-Pak and a few days later symptoms seem to improve.  He states he does often have similar symptoms around this time of year.  Had a Covid test 2 weeks ago that was negative.        Past Medical History:  Diagnosis Date  . Asthma     There are no problems to display for this patient.   History reviewed. No pertinent surgical history.     Family History  Problem Relation Age of Onset  . Colon cancer Mother   . Asthma Father   . Hypertension Father   . Stroke Father   . Heart disease Father     Social History   Tobacco Use  . Smoking status: Former Smoker    Packs/day: 0.20    Years: 6.00    Pack years: 1.20    Types: Cigars    Start date: 2014    Quit date: 01/04/2019    Years since quitting: 1.1  . Smokeless tobacco: Never Used  Vaping Use  . Vaping Use: Never used  Substance Use Topics  . Alcohol use: No  . Drug  use: Not Currently    Types: Marijuana    Home Medications Prior to Admission medications   Medication Sig Start Date End Date Taking? Authorizing Provider  amoxicillin-clavulanate (AUGMENTIN) 875-125 MG tablet Take 1 tablet by mouth every 12 (twelve) hours. 05/22/19   Lorelee New, PA-C  diphenhydrAMINE (BENADRYL) 25 MG tablet Take 1 tablet (25 mg total) by mouth every 6 (six) hours as needed. 08/16/19   Mannie Stabile, PA-C  famotidine (PEPCID) 40 MG tablet Take 1 tablet (40 mg total) by mouth daily. 08/16/19   Mannie Stabile, PA-C  fluticasone (FLONASE) 50 MCG/ACT nasal spray Place 2 sprays into both nostrils daily for 21 days. 05/22/19 06/12/19  Lorelee New, PA-C  hydrOXYzine (ATARAX/VISTARIL) 25 MG tablet Take 1 tablet (25 mg total) by mouth every 6 (six) hours as needed. 04/30/19   Dione Booze, MD  naproxen sodium (ALEVE) 220 MG tablet Take 220 mg by mouth as needed.    [provider]  pantoprazole (PROTONIX) 20 MG tablet TAKE 1 TABLET BY MOUTH EVERY DAY 06/17/19   Steffanie Dunn, DO  permethrin (ELIMITE) 5 % cream Apply  to affected area once 11/04/19   Khatri, Hina, PA-C  predniSONE (DELTASONE) 50 MG tablet Take 1 tablet (50 mg total) by mouth daily. 04/30/19   Dione Booze, MD  triamcinolone cream (KENALOG) 0.1 % Apply 1 application topically 2 (two) times daily. 10/14/19   Couture, Cortni S, PA-C    Allergies    Patient has no known allergies.  Review of Systems   Review of Systems  Constitutional: Negative for chills and fever.  HENT: Positive for congestion, rhinorrhea and sinus pressure. Negative for ear pain, sore throat and trouble swallowing.   Eyes: Negative for visual disturbance.  Respiratory: Positive for cough. Negative for shortness of breath.   Cardiovascular: Negative for chest pain.  Gastrointestinal: Negative for abdominal pain, diarrhea, nausea and vomiting.  Genitourinary: Negative for dysuria and frequency.  Musculoskeletal: Negative for  arthralgias and myalgias.  Skin: Negative for color change and rash.  Neurological: Negative for headaches.    Physical Exam Updated Vital Signs BP (!) 156/81   Pulse 95   Temp 98.2 F (36.8 C) (Oral)   Resp 14   Ht 5\' 11"  (1.803 m)   Wt 93 kg   SpO2 97%   BMI 28.59 kg/m   Physical Exam Vitals and nursing note reviewed.  Constitutional:      General: He is not in acute distress.    Appearance: Normal appearance. He is well-developed. He is not ill-appearing or diaphoretic.     Comments: Well-appearing and in no distress  HENT:     Head: Normocephalic and atraumatic.     Nose: Congestion and rhinorrhea present.     Mouth/Throat:     Mouth: Mucous membranes are moist.     Pharynx: Oropharynx is clear.     Comments: Posterior oropharynx clear and mucous membranes moist, there is mild erythema but no edema or tonsillar exudates, uvula midline, normal phonation, no trismus, tolerating secretions without difficulty. Eyes:     General:        Right eye: No discharge.        Left eye: No discharge.  Neck:     Comments: No rigidity Cardiovascular:     Rate and Rhythm: Normal rate and regular rhythm.     Heart sounds: Normal heart sounds.  Pulmonary:     Effort: Pulmonary effort is normal. No respiratory distress.     Breath sounds: Normal breath sounds.  Abdominal:     General: Bowel sounds are normal. There is no distension.     Palpations: Abdomen is soft. There is no mass.     Tenderness: There is no abdominal tenderness. There is no guarding.     Comments: Abdomen soft, nondistended, nontender to palpation in all quadrants without guarding or peritoneal signs  Musculoskeletal:        General: No deformity.     Cervical back: Neck supple.  Lymphadenopathy:     Cervical: No cervical adenopathy.  Skin:    General: Skin is warm and dry.     Capillary Refill: Capillary refill takes less than 2 seconds.  Neurological:     Mental Status: He is alert and oriented to  person, place, and time.  Psychiatric:        Mood and Affect: Mood normal.        Behavior: Behavior normal.     ED Results / Procedures / Treatments   Labs (all labs ordered are listed, but only abnormal results are displayed) Labs Reviewed  RESPIRATORY PANEL BY RT PCR (  FLU A&B, COVID)    EKG None  Radiology No results found.  Procedures Procedures (including critical care time)  Medications Ordered in ED Medications - No data to display  ED Course  I have reviewed the triage vital signs and the nursing notes.  Pertinent labs & imaging results that were available during my care of the patient were reviewed by me and considered in my medical decision making (see chart for details).    MDM Rules/Calculators/A&P                           Pt presents with nasal congestion and rhinorrhea, states he has an occasional cough usually only in the morning. Pt is well appearing and vitals are normal. Lungs CTA on exam.  No hypoxia, tachypnea or increased work of breathing, low suspicion for pneumonia, do not feel that chest imaging is indicated.  Patients symptoms are consistent with allergic rhinitis versus viral URI, Covid and flu testing sent.  Discussed that antibiotics are not indicated for viral infections. Pt will be discharged with symptomatic treatment including Flonase and Zyrtec.  Verbalizes understanding and is agreeable with plan. Pt is hemodynamically stable & in NAD prior to dc. Return precautions discussed, pt expresses understanding and agrees with plan.   Chioke Noxon was evaluated in Emergency Department on 03/05/2020 for the symptoms described in the history of present illness. He was evaluated in the context of the global COVID-19 pandemic, which necessitated consideration that the patient might be at risk for infection with the SARS-CoV-2 virus that causes COVID-19. Institutional protocols and algorithms that pertain to the evaluation of patients at risk for  COVID-19 are in a state of rapid change based on information released by regulatory bodies including the CDC and federal and state organizations. These policies and algorithms were followed during the patient's care in the ED.  Final Clinical Impression(s) / ED Diagnoses Final diagnoses:  Nasal congestion    Rx / DC Orders ED Discharge Orders    None       Dartha Lodge, [place] 03/05/20 Merrily Brittle    Alvira Monday, MD 03/07/20 1534

## 2020-04-26 ENCOUNTER — Encounter (HOSPITAL_COMMUNITY): Payer: Self-pay | Admitting: Emergency Medicine

## 2020-04-26 ENCOUNTER — Other Ambulatory Visit: Payer: Self-pay

## 2020-04-26 ENCOUNTER — Emergency Department (HOSPITAL_COMMUNITY)
Admission: EM | Admit: 2020-04-26 | Discharge: 2020-04-26 | Disposition: A | Discharge status: Home / Self Care | Payer: BLUE CROSS/BLUE SHIELD | Attending: Emergency Medicine | Admitting: Emergency Medicine

## 2020-04-26 DIAGNOSIS — R509 Fever, unspecified: Secondary | ICD-10-CM | POA: Diagnosis not present

## 2020-04-26 DIAGNOSIS — Z5321 Procedure and treatment not carried out due to patient leaving prior to being seen by health care provider: Secondary | ICD-10-CM | POA: Insufficient documentation

## 2020-04-26 NOTE — ED Triage Notes (Signed)
Pt reports he woke up this morning feeling like he had a fever. Pt requesting covid test. Displaying no symptoms at this time.

## 2020-07-24 ENCOUNTER — Emergency Department (HOSPITAL_COMMUNITY)
Admission: EM | Admit: 2020-07-24 | Discharge: 2020-07-24 | Disposition: A | Discharge status: Home / Self Care | Payer: BLUE CROSS/BLUE SHIELD | Attending: Emergency Medicine | Admitting: Emergency Medicine

## 2020-07-24 DIAGNOSIS — Z7951 Long term (current) use of inhaled steroids: Secondary | ICD-10-CM | POA: Insufficient documentation

## 2020-07-24 DIAGNOSIS — Z87891 Personal history of nicotine dependence: Secondary | ICD-10-CM | POA: Diagnosis not present

## 2020-07-24 DIAGNOSIS — J069 Acute upper respiratory infection, unspecified: Secondary | ICD-10-CM | POA: Insufficient documentation

## 2020-07-24 DIAGNOSIS — J3489 Other specified disorders of nose and nasal sinuses: Secondary | ICD-10-CM | POA: Insufficient documentation

## 2020-07-24 DIAGNOSIS — J45909 Unspecified asthma, uncomplicated: Secondary | ICD-10-CM | POA: Diagnosis not present

## 2020-07-24 DIAGNOSIS — R059 Cough, unspecified: Secondary | ICD-10-CM | POA: Diagnosis present

## 2020-07-24 NOTE — ED Notes (Signed)
Pt d/c home per MD order. Discharge summary reviewed with pt, pt verbalizes understanding. No s/s of acute distress noted at discharge. Ambulatory off unit.  

## 2020-07-24 NOTE — ED Triage Notes (Signed)
Pt to ED via POV, Reports he was treated for bronchitis 3 weeks ago, took amoxicillin as prescribed, reports felt better for 3-4 days, but now Over the past two days has had a cough. Reports he feels the same as he did when he was diagnosed with Bronchitis.  No Fever, No SHOB.

## 2020-07-24 NOTE — Discharge Instructions (Signed)
Use Tylenol every 4 hours as needed for body aches and fever. Stay well-hydrated with water. Use tea and honey/lemon for cough. For allergy type symptoms itchy eyes, runny nose you can try over-the-counter antihistamines at the pharmacy. Return for shortness of breath or new concerns.

## 2020-07-24 NOTE — ED Provider Notes (Signed)
[facility] EMERGENCY DEPARTMENT Provider Note   CSN: [id number] Arrival date & time: 07/24/20  0825     History Chief Complaint  Patient presents with  . Cough    Todd Barajas is a 25 y.o. male.  Patient presents with worsening cough, congestion and runny nose for the past 3 days.  Patient had bronchitis 3 weeks ago and was given amoxicillin and gradually felt better.  No fevers.  Patient's been around the child sneezing and coughing recently.  Patient has mild asthma controlled.        Past Medical History:  Diagnosis Date  . Asthma     There are no problems to display for this patient.   No past surgical history on file.     Family History  Problem Relation Age of Onset  . Colon cancer Mother   . Asthma Father   . Hypertension Father   . Stroke Father   . Heart disease Father     Social History   Tobacco Use  . Smoking status: Former Smoker    Packs/day: 0.20    Years: 6.00    Pack years: 1.20    Types: Cigars    Start date: 2014    Quit date: 01/04/2019    Years since quitting: 1.5  . Smokeless tobacco: Never Used  Vaping Use  . Vaping Use: Never used  Substance Use Topics  . Alcohol use: No  . Drug use: Not Currently    Types: Marijuana    Home Medications Prior to Admission medications   Medication Sig Start Date End Date Taking? Authorizing Provider  amoxicillin-clavulanate (AUGMENTIN) 875-125 MG tablet Take 1 tablet by mouth every 12 (twelve) hours. 05/22/19   Lorelee New, PA-C  diphenhydrAMINE (BENADRYL) 25 MG tablet Take 1 tablet (25 mg total) by mouth every 6 (six) hours as needed. 08/16/19   Mannie Stabile, PA-C  famotidine (PEPCID) 40 MG tablet Take 1 tablet (40 mg total) by mouth daily. 08/16/19   Mannie Stabile, PA-C  fluticasone (FLONASE) 50 MCG/ACT nasal spray Place 2 sprays into both nostrils daily for 21 days. 05/22/19 06/12/19  Lorelee New, PA-C  hydrOXYzine (ATARAX/VISTARIL) 25 MG tablet Take 1  tablet (25 mg total) by mouth every 6 (six) hours as needed. 04/30/19   Dione Booze, MD  naproxen sodium (ALEVE) 220 MG tablet Take 220 mg by mouth as needed.    [provider]  pantoprazole (PROTONIX) 20 MG tablet TAKE 1 TABLET BY MOUTH EVERY DAY 06/17/19   Steffanie Dunn, DO  permethrin (ELIMITE) 5 % cream Apply to affected area once 11/04/19   Khatri, Hina, PA-C  predniSONE (DELTASONE) 50 MG tablet Take 1 tablet (50 mg total) by mouth daily. 04/30/19   Dione Booze, MD  triamcinolone cream (KENALOG) 0.1 % Apply 1 application topically 2 (two) times daily. 10/14/19   Couture, Cortni S, PA-C    Allergies    Patient has no known allergies.  Review of Systems   Review of Systems  Constitutional: Negative for chills and fever.  HENT: Positive for congestion.   Eyes: Negative for visual disturbance.  Respiratory: Positive for cough. Negative for shortness of breath.   Cardiovascular: Negative for chest pain.  Gastrointestinal: Negative for abdominal pain and vomiting.  Genitourinary: Negative for dysuria and flank pain.  Musculoskeletal: Negative for back pain, neck pain and neck stiffness.  Skin: Negative for rash.  Neurological: Negative for light-headedness and headaches.    Physical Exam  Updated Vital Signs BP (!) 130/91   Pulse 64   Temp 97.7 F (36.5 C) (Oral)   Resp 16   Ht 5\' 11"  (1.803 m)   Wt 104.3 kg   SpO2 100%   BMI 32.08 kg/m   Physical Exam Vitals and nursing note reviewed.  Constitutional:      Appearance: He is well-developed.  HENT:     Head: Normocephalic and atraumatic.     Nose: Congestion and rhinorrhea present.  Eyes:     General:        Right eye: No discharge.        Left eye: No discharge.     Conjunctiva/sclera: Conjunctivae normal.  Neck:     Trachea: No tracheal deviation.  Cardiovascular:     Rate and Rhythm: Normal rate and regular rhythm.  Pulmonary:     Effort: Pulmonary effort is normal.     Breath sounds: Normal breath  sounds.  Abdominal:     General: There is no distension.     Palpations: Abdomen is soft.     Tenderness: There is no abdominal tenderness. There is no guarding.  Musculoskeletal:     Cervical back: Normal range of motion and neck supple.  Skin:    General: Skin is warm.     Findings: No rash.  Neurological:     Mental Status: He is alert and oriented to person, place, and time.     ED Results / Procedures / Treatments   Labs (all labs ordered are listed, but only abnormal results are displayed) Labs Reviewed - No data to display  EKG None  Radiology No results found.  Procedures Procedures   Medications Ordered in ED Medications - No data to display  ED Course  I have reviewed the triage vital signs and the nursing notes.  Pertinent labs & imaging results that were available during my care of the patient were reviewed by me and considered in my medical decision making (see chart for details).    MDM Rules/Calculators/A&P                          Patient presents with clinical concern for acute upper respiratory infection likely viral in origin.  Other differentials discussed with patient including allergy related.  No signs of serious bacterial infection at this time no indication for antibiotics.  Lungs are clear, oxygen normal.  Follow-up discussed and work note given. Final Clinical Impression(s) / ED Diagnoses Final diagnoses:  Acute upper respiratory infection    Rx / DC Orders ED Discharge Orders    None       , MD 07/24/20 [phone]

## 2020-09-28 ENCOUNTER — Emergency Department (HOSPITAL_COMMUNITY)
Admission: EM | Admit: 2020-09-28 | Discharge: 2020-09-28 | Disposition: A | Discharge status: Home / Self Care | Payer: BLUE CROSS/BLUE SHIELD | Attending: Emergency Medicine | Admitting: Emergency Medicine

## 2020-09-28 ENCOUNTER — Encounter (HOSPITAL_COMMUNITY): Payer: Self-pay

## 2020-09-28 ENCOUNTER — Other Ambulatory Visit: Payer: Self-pay

## 2020-09-28 DIAGNOSIS — Z87891 Personal history of nicotine dependence: Secondary | ICD-10-CM | POA: Diagnosis not present

## 2020-09-28 DIAGNOSIS — J069 Acute upper respiratory infection, unspecified: Secondary | ICD-10-CM | POA: Diagnosis not present

## 2020-09-28 DIAGNOSIS — J45909 Unspecified asthma, uncomplicated: Secondary | ICD-10-CM | POA: Diagnosis not present

## 2020-09-28 DIAGNOSIS — J029 Acute pharyngitis, unspecified: Secondary | ICD-10-CM | POA: Diagnosis present

## 2020-09-28 DIAGNOSIS — Z7951 Long term (current) use of inhaled steroids: Secondary | ICD-10-CM | POA: Insufficient documentation

## 2020-09-28 LAB — GROUP A STREP BY PCR: Group A Strep by PCR: NOT DETECTED

## 2020-09-28 NOTE — ED Provider Notes (Signed)
[facility] EMERGENCY DEPARTMENT Provider Note   CSN: [id number] Arrival date & time: 09/28/20  1124     History Chief Complaint  Patient presents with  . Sore Throat    Todd Barajas is a 25 y.o. male.  The history is provided by the patient.  Sore Throat This is a new problem. Episode onset: 3 days. The problem occurs constantly. Progression since onset: waxing and waning. Pertinent negatives include no chest pain, no abdominal pain, no headaches and no shortness of breath. Associated symptoms comments: Minimal cough and no fever.  Nasal congestion and drainage down the back of the throat with a stratchy throat.  Feels like asthma is good.  No known sick contacts.  Multiple neg covid tests at home.. Exacerbated by: swallowing. Nothing relieves the symptoms. He has tried nothing for the symptoms. The treatment provided no relief.       Past Medical History:  Diagnosis Date  . Asthma     There are no problems to display for this patient.   History reviewed. No pertinent surgical history.     Family History  Problem Relation Age of Onset  . Colon cancer Mother   . Asthma Father   . Hypertension Father   . Stroke Father   . Heart disease Father     Social History   Tobacco Use  . Smoking status: Former Smoker    Packs/day: 0.20    Years: 6.00    Pack years: 1.20    Types: Cigars    Start date: 2014    Quit date: 01/04/2019    Years since quitting: 1.7  . Smokeless tobacco: Never Used  Vaping Use  . Vaping Use: Never used  Substance Use Topics  . Alcohol use: No  . Drug use: Not Currently    Types: Marijuana    Home Medications Prior to Admission medications   Medication Sig Start Date End Date Taking? Authorizing Provider  amoxicillin-clavulanate (AUGMENTIN) 875-125 MG tablet Take 1 tablet by mouth every 12 (twelve) hours. 05/22/19   Lorelee New, PA-C  diphenhydrAMINE (BENADRYL) 25 MG tablet Take 1 tablet (25 mg total) by mouth  every 6 (six) hours as needed. 08/16/19   Mannie Stabile, PA-C  famotidine (PEPCID) 40 MG tablet Take 1 tablet (40 mg total) by mouth daily. 08/16/19   Mannie Stabile, PA-C  fluticasone (FLONASE) 50 MCG/ACT nasal spray Place 2 sprays into both nostrils daily for 21 days. 05/22/19 06/12/19  Lorelee New, PA-C  hydrOXYzine (ATARAX/VISTARIL) 25 MG tablet Take 1 tablet (25 mg total) by mouth every 6 (six) hours as needed. 04/30/19   Dione Booze, MD  naproxen sodium (ALEVE) 220 MG tablet Take 220 mg by mouth as needed.    [provider]  pantoprazole (PROTONIX) 20 MG tablet TAKE 1 TABLET BY MOUTH EVERY DAY 06/17/19   Steffanie Dunn, DO  permethrin (ELIMITE) 5 % cream Apply to affected area once 11/04/19   Khatri, Hina, PA-C  predniSONE (DELTASONE) 50 MG tablet Take 1 tablet (50 mg total) by mouth daily. 04/30/19   Dione Booze, MD  triamcinolone cream (KENALOG) 0.1 % Apply 1 application topically 2 (two) times daily. 10/14/19   Couture, Cortni S, PA-C    Allergies    Patient has no known allergies.  Review of Systems   Review of Systems  Respiratory: Negative for shortness of breath.   Cardiovascular: Negative for chest pain.  Gastrointestinal: Negative for abdominal pain.  Neurological: Negative  for headaches.  All other systems reviewed and are negative.   Physical Exam Updated Vital Signs BP (!) 143/87 (BP Location: Right Arm)   Pulse 74   Temp (!) 97.2 F (36.2 C) (Tympanic)   Resp 15   SpO2 99%   Physical Exam Vitals and nursing note reviewed.  Constitutional:      General: He is not in acute distress.    Appearance: He is well-developed.  HENT:     Head: Normocephalic and atraumatic.     Right Ear: Tympanic membrane normal.     Left Ear: Tympanic membrane normal.     Nose: Congestion and rhinorrhea present.     Mouth/Throat:     Mouth: Mucous membranes are moist. No oral lesions.     Pharynx: Posterior oropharyngeal erythema present. No oropharyngeal exudate  or uvula swelling.     Tonsils: No tonsillar exudate.  Eyes:     Conjunctiva/sclera: Conjunctivae normal.     Pupils: Pupils are equal, round, and reactive to light.  Cardiovascular:     Rate and Rhythm: Normal rate and regular rhythm.     Pulses: Normal pulses.     Heart sounds: No murmur heard.   Pulmonary:     Effort: Pulmonary effort is normal. No respiratory distress.     Breath sounds: Normal breath sounds. No wheezing or rales.  Abdominal:     General: There is no distension.     Palpations: Abdomen is soft.     Tenderness: There is no abdominal tenderness. There is no guarding or rebound.  Musculoskeletal:        General: No tenderness. Normal range of motion.     Cervical back: Normal range of motion and neck supple. No tenderness.  Lymphadenopathy:     Cervical: No cervical adenopathy.  Skin:    General: Skin is warm and dry.     Findings: No erythema or rash.  Neurological:     Mental Status: He is alert and oriented to person, place, and time. Mental status is at baseline.  Psychiatric:        Mood and Affect: Mood normal.        Behavior: Behavior normal.        Thought Content: Thought content normal.     ED Results / Procedures / Treatments   Labs (all labs ordered are listed, but only abnormal results are displayed) Labs Reviewed  GROUP A STREP BY PCR    EKG None  Radiology No results found.  Procedures Procedures   Medications Ordered in ED Medications - No data to display  ED Course  I have reviewed the triage vital signs and the nursing notes.  Pertinent labs & imaging results that were available during my care of the patient were reviewed by me and considered in my medical decision making (see chart for details).    MDM Rules/Calculators/A&P                          Pt with symptoms consistent with viral URI, tested neg for COVID at home.  Well appearing here.  No signs of breathing difficulty  No signs of otitis or abnormal abdominal  findings.   Rapid strep pending but pt wants to leave and pt to return with any further problems.  1:50 PM Strep is neg.  Final Clinical Impression(s) / ED Diagnoses Final diagnoses:  Viral upper respiratory tract infection    Rx / DC Orders ED  Discharge Orders    None       Gwyneth Sprout, MD 09/28/20 1350

## 2020-09-28 NOTE — ED Provider Notes (Signed)
Emergency Medicine Provider Triage Evaluation Note  Todd Barajas , a 25 y.o. male  was evaluated in triage.  Pt complains of sore throat x 3 days. Also has watery eyes, congestion, runny nose, cough. H/O allergies, hasn't been on medicine. One dose of COVID vaccine.   Review of Systems  Positive: See HPI Negative: Fever, chills, nausea, vomiting   Physical Exam  BP (!) 164/101 (BP Location: Barajas Arm)   Pulse 65   Temp 98.4 F (36.9 C) (Oral)   Resp 18   SpO2 94%  Gen:   Awake, no distress   Resp:  Normal effort  MSK:   Moves extremities without difficulty  Other:  Pharynx erythematous. Nasal turbinates injected, clear rhinorrhea.   Medical Decision Making  Medically screening exam initiated at 11:36 AM.  Appropriate orders placed.  Todd Barajas was informed that the remainder of the evaluation will be completed by another provider, this initial triage assessment does not replace that evaluation, and the importance of remaining in the ED until their evaluation is complete.    Todd Arista, PA-C 09/28/20 1138    Todd Barrette, MD 10/06/20 2125

## 2020-09-28 NOTE — ED Triage Notes (Signed)
Pt reports he started having a sore throat x3 days. Pt reports stuffy nose.

## 2020-09-28 NOTE — Discharge Instructions (Signed)
Will call you if the strep test is abnormal.  Otherwise this is a virus which will take some time to get better.  Stay hydrated and rest.

## 2020-10-25 ENCOUNTER — Other Ambulatory Visit: Payer: Self-pay

## 2020-10-25 ENCOUNTER — Emergency Department (HOSPITAL_BASED_OUTPATIENT_CLINIC_OR_DEPARTMENT_OTHER)
Admission: EM | Admit: 2020-10-25 | Discharge: 2020-10-26 | Disposition: A | Discharge status: Home / Self Care | Payer: BLUE CROSS/BLUE SHIELD | Attending: Emergency Medicine | Admitting: Emergency Medicine

## 2020-10-25 ENCOUNTER — Emergency Department (HOSPITAL_BASED_OUTPATIENT_CLINIC_OR_DEPARTMENT_OTHER): Payer: BLUE CROSS/BLUE SHIELD

## 2020-10-25 ENCOUNTER — Encounter (HOSPITAL_BASED_OUTPATIENT_CLINIC_OR_DEPARTMENT_OTHER): Payer: Self-pay

## 2020-10-25 DIAGNOSIS — Z87891 Personal history of nicotine dependence: Secondary | ICD-10-CM | POA: Diagnosis not present

## 2020-10-25 DIAGNOSIS — S8002XA Contusion of left knee, initial encounter: Secondary | ICD-10-CM | POA: Insufficient documentation

## 2020-10-25 DIAGNOSIS — Z7952 Long term (current) use of systemic steroids: Secondary | ICD-10-CM | POA: Insufficient documentation

## 2020-10-25 DIAGNOSIS — W51XXXA Accidental striking against or bumped into by another person, initial encounter: Secondary | ICD-10-CM | POA: Diagnosis not present

## 2020-10-25 DIAGNOSIS — Y9367 Activity, basketball: Secondary | ICD-10-CM | POA: Diagnosis not present

## 2020-10-25 DIAGNOSIS — S8992XA Unspecified injury of left lower leg, initial encounter: Secondary | ICD-10-CM | POA: Diagnosis present

## 2020-10-25 DIAGNOSIS — J45909 Unspecified asthma, uncomplicated: Secondary | ICD-10-CM | POA: Diagnosis not present

## 2020-10-25 NOTE — ED Provider Notes (Signed)
[facility] EMERGENCY DEPT Provider Note   CSN: [id number] Arrival date & time: 10/25/20  2306     History Chief Complaint  Patient presents with   Knee Pain    Todd Barajas is a 25 y.o. male.  HPI     This is a 25 year old male with a history of asthma who presents with left knee pain.  Patient reports that he was playing basketball earlier this afternoon when he collided with another player.  He states that their knees hit the other.  He continued to play; however once he stopped playing he noted increasing pain to the left knee.  Prior to taking Aleve the pain was 8 out of 10.  It sounds 6 out of 10.  It is worse with bending of the knee.  He has been ambulatory but with pain.  Denies numbness or tingling.  Denies other injury.  Past Medical History:  Diagnosis Date   Asthma     There are no problems to display for this patient.   History reviewed. No pertinent surgical history.     Family History  Problem Relation Age of Onset   Colon cancer Mother    Asthma Father    Hypertension Father    Stroke Father    Heart disease Father     Social History   Tobacco Use   Smoking status: Former    Packs/day: 0.20    Years: 6.00    Pack years: 1.20    Types: Cigars, Cigarettes    Start date: 2014    Quit date: 01/04/2019    Years since quitting: 1.8   Smokeless tobacco: Never  Vaping Use   Vaping Use: Never used  Substance Use Topics   Alcohol use: No   Drug use: Not Currently    Types: Marijuana    Home Medications Prior to Admission medications   Medication Sig Start Date End Date Taking? Authorizing Provider  amoxicillin-clavulanate (AUGMENTIN) 875-125 MG tablet Take 1 tablet by mouth every 12 (twelve) hours. 05/22/19   Lorelee New, PA-C  diphenhydrAMINE (BENADRYL) 25 MG tablet Take 1 tablet (25 mg total) by mouth every 6 (six) hours as needed. 08/16/19   Mannie Stabile, PA-C  famotidine (PEPCID) 40 MG tablet Take 1 tablet (40 mg  total) by mouth daily. 08/16/19   Mannie Stabile, PA-C  fluticasone (FLONASE) 50 MCG/ACT nasal spray Place 2 sprays into both nostrils daily for 21 days. 05/22/19 06/12/19  Lorelee New, PA-C  hydrOXYzine (ATARAX/VISTARIL) 25 MG tablet Take 1 tablet (25 mg total) by mouth every 6 (six) hours as needed. 04/30/19   Dione Booze, MD  naproxen sodium (ALEVE) 220 MG tablet Take 220 mg by mouth as needed.    [provider]  pantoprazole (PROTONIX) 20 MG tablet TAKE 1 TABLET BY MOUTH EVERY DAY 06/17/19   Steffanie Dunn, DO  permethrin (ELIMITE) 5 % cream Apply to affected area once 11/04/19   Khatri, Hina, PA-C  predniSONE (DELTASONE) 50 MG tablet Take 1 tablet (50 mg total) by mouth daily. 04/30/19   Dione Booze, MD  triamcinolone cream (KENALOG) 0.1 % Apply 1 application topically 2 (two) times daily. 10/14/19   Couture, Cortni S, PA-C    Allergies    Patient has no known allergies.  Review of Systems   Review of Systems  Constitutional:  Negative for fever.  Musculoskeletal:        Left knee pain  Skin:  Negative for wound.  All  other systems reviewed and are negative.  Physical Exam Updated Vital Signs BP 139/89 (BP Location: Left Arm)   Pulse 73   Temp 98.3 F (36.8 C) (Oral)   Resp 16   Ht 1.803 m (5\' 11" )   Wt 113.4 kg   SpO2 99%   BMI 34.87 kg/m   Physical Exam Vitals and nursing note reviewed.  Constitutional:      Appearance: He is well-developed. He is obese. He is not ill-appearing.  HENT:     Head: Normocephalic and atraumatic.  Eyes:     Pupils: Pupils are equal, round, and reactive to light.  Cardiovascular:     Rate and Rhythm: Normal rate and regular rhythm.  Pulmonary:     Effort: Pulmonary effort is normal. No respiratory distress.  Abdominal:     Palpations: Abdomen is soft.  Musculoskeletal:     Cervical back: Neck supple.     Comments: Tenderness to palpation anterior knee, no joint line tenderness, no significant effusion, no overlying skin  changes, normal range of motion but guarded secondary to pain, patient can fire quad, no patellar defect noted  Lymphadenopathy:     Cervical: No cervical adenopathy.  Skin:    General: Skin is warm and dry.  Neurological:     Mental Status: He is alert and oriented to person, place, and time.  Psychiatric:        Mood and Affect: Mood normal.    ED Results / Procedures / Treatments   Labs (all labs ordered are listed, but only abnormal results are displayed) Labs Reviewed - No data to display  EKG None  Radiology DG Knee Complete 4 Views Left  Result Date: 10/26/2020 CLINICAL DATA:  Fall this afternoon playing basketball. Left knee pain. EXAM: LEFT KNEE - COMPLETE 4+ VIEW COMPARISON:  None. FINDINGS: No fracture or dislocation. Normal alignment and joint spaces. There is a moderate knee joint effusion. Mild soft tissue edema. IMPRESSION: Moderate knee joint effusion. No fracture or dislocation. Electronically Signed   By: 12/27/2020 M.D.   On: 10/26/2020 00:00    Procedures Procedures   Medications Ordered in ED Medications - No data to display  ED Course  I have reviewed the triage vital signs and the nursing notes.  Pertinent labs & imaging results that were available during my care of the patient were reviewed by me and considered in my medical decision making (see chart for details).    MDM Rules/Calculators/A&P                          Patient presents with left knee pain.  Describes injury with direct impact to the knee.  He has been ambulatory.  Exam is fairly benign without any significant overlying skin changes and normal range of motion.  Considerations include but not limited to, contusion, fracture, ligamentous or meniscus injury although history would be more suggestive of the former.  Patient was given ice.  X-rays obtained.  These were independently reviewed by myself and showed no evidence of acute fracture.  Recommend ongoing ice and NSAIDs.  After  history, exam, and medical workup I feel the patient has been appropriately medically screened and is safe for discharge home. Pertinent diagnoses were discussed with the patient. Patient was given return precautions.  Final Clinical Impression(s) / ED Diagnoses Final diagnoses:  Contusion of left knee, initial encounter    Rx / DC Orders ED Discharge Orders  None        Shon Baton, MD 10/26/20 [phone]

## 2020-10-25 NOTE — ED Triage Notes (Signed)
Pt arrived EMS for a fall that happened this afternoon while playing basketball. Pt's only complaint is left knee pain and is able to bear weight but it is very painful. Pt took Aleve at appx 2200.

## 2020-10-26 NOTE — Discharge Instructions (Addendum)
You were seen today for left knee pain.  You likely have contusion or bruising.  Your x-rays do not show any fracture.  Ice and elevate.  Continue to take Aleve for pain.

## 2021-01-10 ENCOUNTER — Emergency Department (HOSPITAL_BASED_OUTPATIENT_CLINIC_OR_DEPARTMENT_OTHER)
Admission: EM | Admit: 2021-01-10 | Discharge: 2021-01-10 | Disposition: A | Discharge status: Home / Self Care | Payer: BLUE CROSS/BLUE SHIELD | Attending: Emergency Medicine | Admitting: Emergency Medicine

## 2021-01-10 ENCOUNTER — Encounter (HOSPITAL_BASED_OUTPATIENT_CLINIC_OR_DEPARTMENT_OTHER): Payer: Self-pay | Admitting: Emergency Medicine

## 2021-01-10 ENCOUNTER — Other Ambulatory Visit: Payer: Self-pay

## 2021-01-10 DIAGNOSIS — Z87891 Personal history of nicotine dependence: Secondary | ICD-10-CM | POA: Diagnosis not present

## 2021-01-10 DIAGNOSIS — J45909 Unspecified asthma, uncomplicated: Secondary | ICD-10-CM | POA: Diagnosis not present

## 2021-01-10 DIAGNOSIS — H9203 Otalgia, bilateral: Secondary | ICD-10-CM | POA: Diagnosis present

## 2021-01-10 DIAGNOSIS — H169 Unspecified keratitis: Secondary | ICD-10-CM | POA: Diagnosis not present

## 2021-01-10 DIAGNOSIS — H5789 Other specified disorders of eye and adnexa: Secondary | ICD-10-CM

## 2021-01-10 MED ORDER — FLUORESCEIN SODIUM 1 MG OP STRP
1.0000 | ORAL_STRIP | Freq: Once | OPHTHALMIC | Status: AC
  Administered 2021-01-10: 1 via OPHTHALMIC
  Filled 2021-01-10: qty 1

## 2021-01-10 MED ORDER — TETRACAINE HCL 0.5 % OP SOLN
2.0000 [drp] | Freq: Once | OPHTHALMIC | Status: AC
  Administered 2021-01-10: 2 [drp] via OPHTHALMIC
  Filled 2021-01-10: qty 4

## 2021-01-10 MED ORDER — CIPROFLOXACIN HCL 0.3 % OP SOLN
2.0000 [drp] | OPHTHALMIC | Status: DC
  Administered 2021-01-10: 2 [drp] via OPHTHALMIC
  Filled 2021-01-10: qty 2.5

## 2021-01-10 NOTE — Discharge Instructions (Addendum)
Please use Cipro drops 2 drops to each eye every 2 hours Please go to Dr. Meda Coffee office at 8 AM tomorrow for recheck The phone number there is [phone] I spoke with Dr. Jenene Slicker in his practice

## 2021-01-10 NOTE — ED Triage Notes (Signed)
He tells me that he wears "30-day contact lenses". He states he left them in for over thirty days; and he removed them 2 days ago. He c/o eye discomfort ever since. His left sclera is red with some minimal crusty drainage. His right eye is very minimally red with no drainage. He is ambulatory and in no distress.

## 2021-01-10 NOTE — ED Provider Notes (Signed)
[facility] EMERGENCY DEPT Provider Note   CSN: [id number] Arrival date & time: 01/10/21  1001     History No chief complaint on file.   Todd Barajas is a 25 y.o. male.  HPI 25 year old male denies any past medical history presents today complaining of bilateral eye pain.  He states that he had long wearing contacts and is postop 3 days which she wore for couple months.  He took these out on Friday just secondary to some eye irritation.  He began noticing redness and increasing pain and with the size with the left worse than the right.  His vision has felt like it is somewhat blurry.  He has not noted any discharge, surrounding redness, nausea, vomiting.  He has not had similar symptoms in the past.  Here his vision is 20/200 bilaterally.    Past Medical History:  Diagnosis Date   Asthma     There are no problems to display for this patient.   History reviewed. No pertinent surgical history.     Family History  Problem Relation Age of Onset   Colon cancer Mother    Asthma Father    Hypertension Father    Stroke Father    Heart disease Father     Social History   Tobacco Use   Smoking status: Former    Packs/day: 0.20    Years: 6.00    Pack years: 1.20    Types: Cigars, Cigarettes    Start date: 2014    Quit date: 01/04/2019    Years since quitting: 2.0   Smokeless tobacco: Never  Vaping Use   Vaping Use: Never used  Substance Use Topics   Alcohol use: No   Drug use: Not Currently    Types: Marijuana    Home Medications Prior to Admission medications   Medication Sig Start Date End Date Taking? Authorizing Provider  amoxicillin-clavulanate (AUGMENTIN) 875-125 MG tablet Take 1 tablet by mouth every 12 (twelve) hours. 05/22/19   Lorelee New, PA-C  diphenhydrAMINE (BENADRYL) 25 MG tablet Take 1 tablet (25 mg total) by mouth every 6 (six) hours as needed. 08/16/19   Mannie Stabile, PA-C  famotidine (PEPCID) 40 MG tablet Take 1 tablet  (40 mg total) by mouth daily. 08/16/19   Mannie Stabile, PA-C  fluticasone (FLONASE) 50 MCG/ACT nasal spray Place 2 sprays into both nostrils daily for 21 days. 05/22/19 06/12/19  Lorelee New, PA-C  hydrOXYzine (ATARAX/VISTARIL) 25 MG tablet Take 1 tablet (25 mg total) by mouth every 6 (six) hours as needed. 04/30/19   Dione Booze, MD  naproxen sodium (ALEVE) 220 MG tablet Take 220 mg by mouth as needed.    [provider]  pantoprazole (PROTONIX) 20 MG tablet TAKE 1 TABLET BY MOUTH EVERY DAY 06/17/19   Steffanie Dunn, DO  permethrin (ELIMITE) 5 % cream Apply to affected area once 11/04/19   Khatri, Hina, PA-C  predniSONE (DELTASONE) 50 MG tablet Take 1 tablet (50 mg total) by mouth daily. 04/30/19   Dione Booze, MD  triamcinolone cream (KENALOG) 0.1 % Apply 1 application topically 2 (two) times daily. 10/14/19   Couture, Cortni S, PA-C    Allergies    Patient has no known allergies.  Review of Systems   Review of Systems  All other systems reviewed and are negative.  Physical Exam Updated Vital Signs There were no vitals taken for this visit.  Physical Exam Vitals and nursing note reviewed.  Constitutional:  Appearance: He is well-developed.     Comments: Heart rate 68 oxygen saturations are 100% respiratory rate is 12 blood pressure is 106/60.  HENT:     Head: Normocephalic and atraumatic.     Right Ear: External ear normal.     Left Ear: External ear normal.     Nose: Nose normal.  Eyes:     General: Lids are normal. Vision grossly intact. Gaze aligned appropriately. Visual field deficit: 6.        Right eye: No foreign body.        Left eye: No foreign body.     Intraocular pressure: Right eye pressure is 6 mmHg. Left eye pressure is 6 mmHg. Measurements were taken using a handheld tonometer.    Extraocular Movements: Extraocular movements intact.     Conjunctiva/sclera:     Right eye: No exudate.    Left eye: Left conjunctiva is injected. No exudate.     Pupils: Pupils are equal, round, and reactive to light.     Right eye: No corneal abrasion or fluorescein uptake.     Left eye: No corneal abrasion or fluorescein uptake.     Slit lamp exam:    Right eye: Anterior chamber quiet. Photophobia present. No corneal ulcer, hyphema or hypopyon.     Left eye: Anterior chamber quiet. Photophobia present. No corneal ulcer, hyphema or hypopyon.     Comments: Patient with some whitish lesion left eye at about 5 o clock left eye  Neck:     Trachea: No tracheal deviation.  Pulmonary:     Effort: Pulmonary effort is normal.  Musculoskeletal:        General: Normal range of motion.  Skin:    General: Skin is warm and dry.  Neurological:     Mental Status: He is alert and oriented to person, place, and time.  Psychiatric:        Mood and Affect: Mood normal.        Behavior: Behavior normal.    ED Results / Procedures / Treatments   Labs (all labs ordered are listed, but only abnormal results are displayed) Labs Reviewed - No data to display  EKG None  Radiology No results found.  Procedures Procedures   Medications Ordered in ED Medications  tetracaine (PONTOCAINE) 0.5 % ophthalmic solution 2 drop (has no administration in time range)  fluorescein ophthalmic strip 1 strip (has no administration in time range)    ED Course  I have reviewed the triage vital signs and the nursing notes.  Pertinent labs & imaging results that were available during my care of the patient were reviewed by me and considered in my medical decision making (see chart for details).    MDM Rules/Calculators/A&P                           Concern for keratits in extended contact wearing individual.   IOC normal bilaterally. Pt is a contact lens wearer.  Exam non-concerning for orbital cellulitis, hyphema, corneal ulcers, corneal abrasions or trauma.  No evidence of HSV  infection.Patient will be placed on   Patient has been instructed to use cool compresses  and practice personal hygiene with frequent hand washing.  Patient understands to follow up with ophthalmology, especially if new symptoms including change in vision, purulent drainage, or entrapment occur.   Patient advised not to wear contacts.  He is advised to have close follow-up with ophthalmology tomorrow. Discussed  with Dr. Jenene Slicker and will see in clinic tomorrow at 0800. Cipro drops q 2 hours No contact lens wearing Final Clinical Impression(s) / ED Diagnoses Final diagnoses:  Keratitis  Irritation of both eyes    Rx / DC Orders ED Discharge Orders     None        Margarita Grizzle, MD 01/10/21 1113

## 2021-07-22 IMAGING — CT CT RENAL STONE PROTOCOL
2 of 4 series · 17 of 46 positions shown, 19 images · non-contrast
Comparison: None.

CLINICAL DATA: Pt c/o RIGHT Flank pain x 1 [DATE] weeks; No difficulty
urinating until yesterday (painful) No prior stones, no surgeries

EXAM:
CT ABDOMEN AND PELVIS WITHOUT CONTRAST
TECHNIQUE: Multidetector CT imaging of the abdomen and pelvis was performed
following the standard protocol without IV contrast.

[Series 3: renal stone 5.0 · axial · 0.77mm/px · z∈[+676,+1090]mm · 14 of 91 slices shown, 16 images]
[im 4/91  soft-tissue]
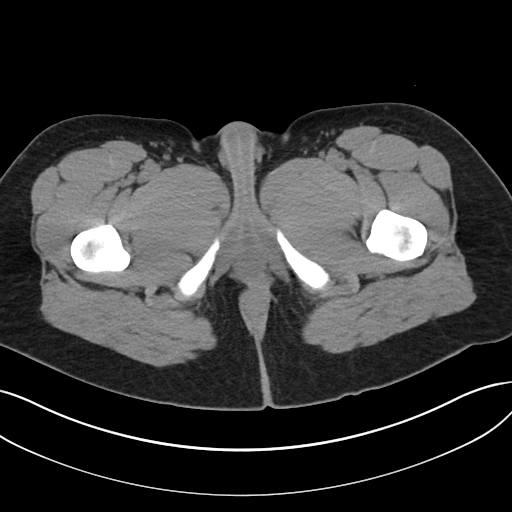
[im 4/91  bone]
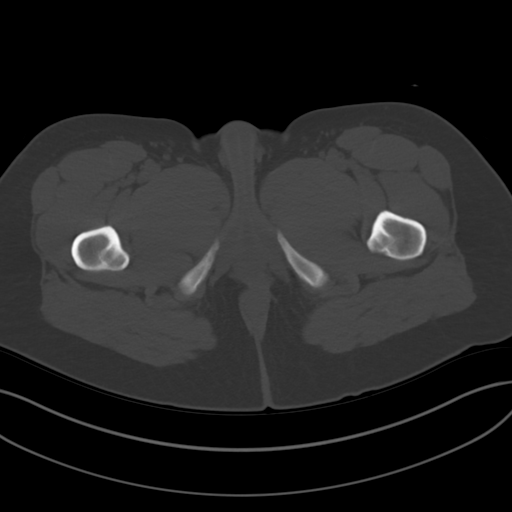
[im 12/91  soft-tissue]
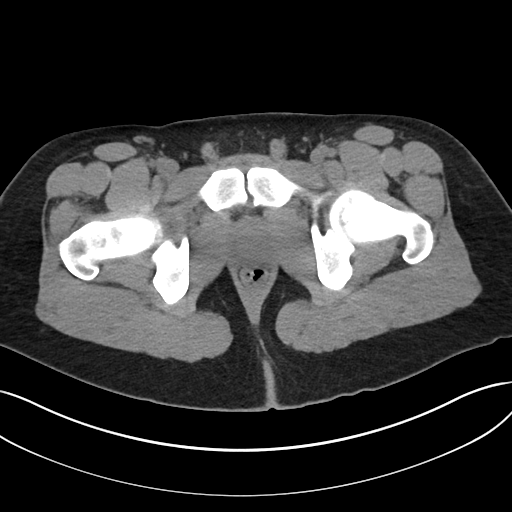
[im 16/91  soft-tissue]
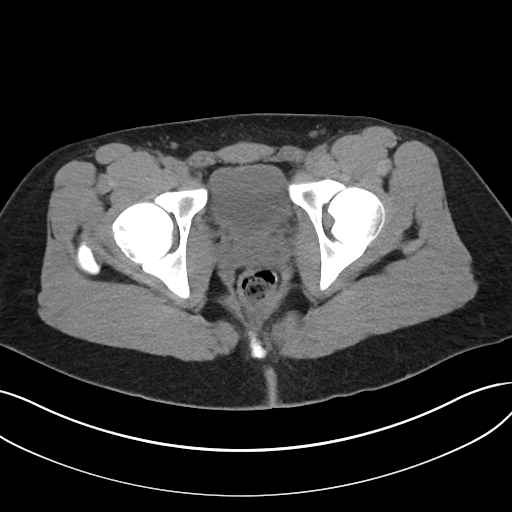
[im 24/91  soft-tissue]
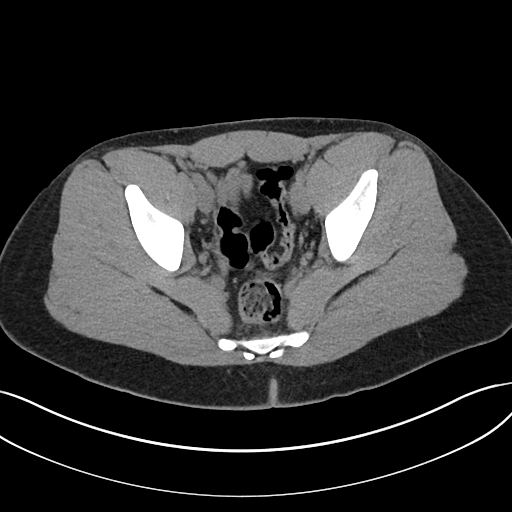
[im 32/91  soft-tissue]
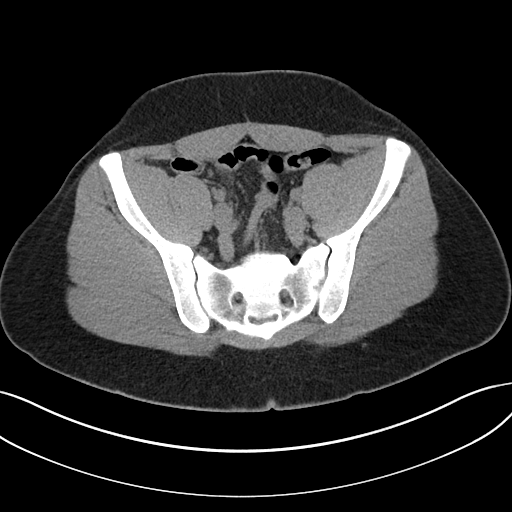
[im 36/91  soft-tissue]
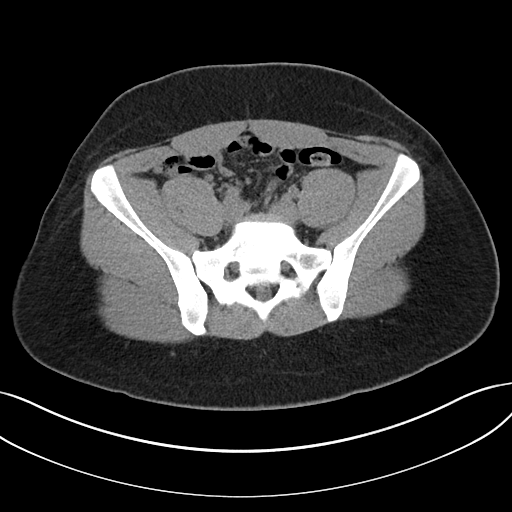
[im 44/91  soft-tissue]
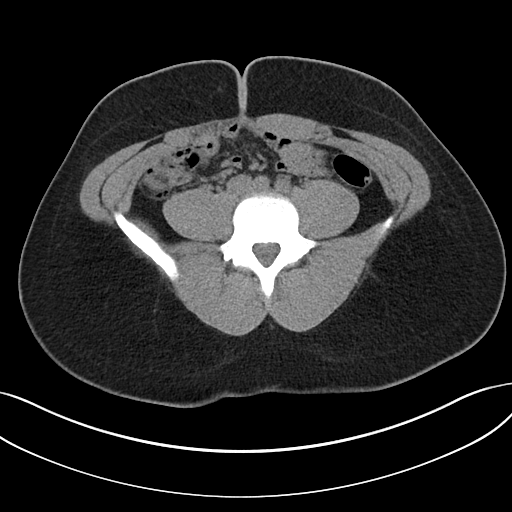
[im 47/91  soft-tissue]
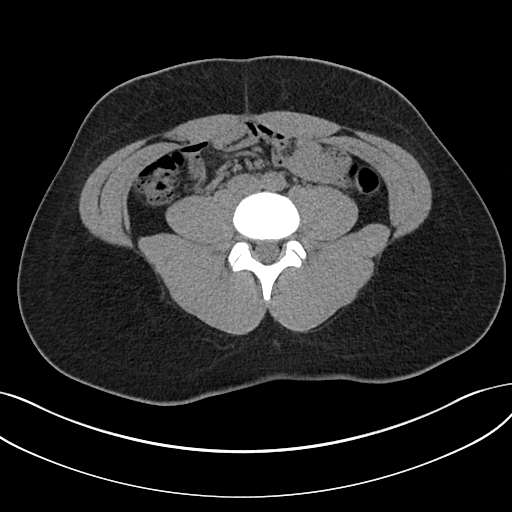
[im 55/91  soft-tissue]
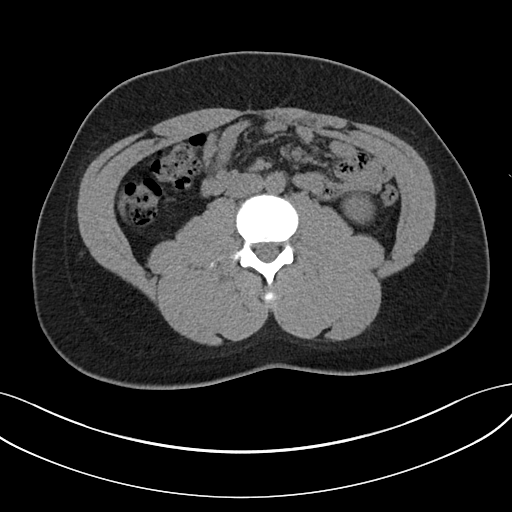
[im 55/91  bone]
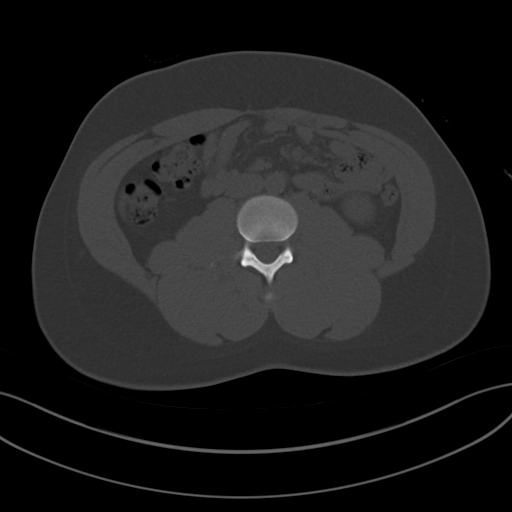
[im 59/91  soft-tissue]
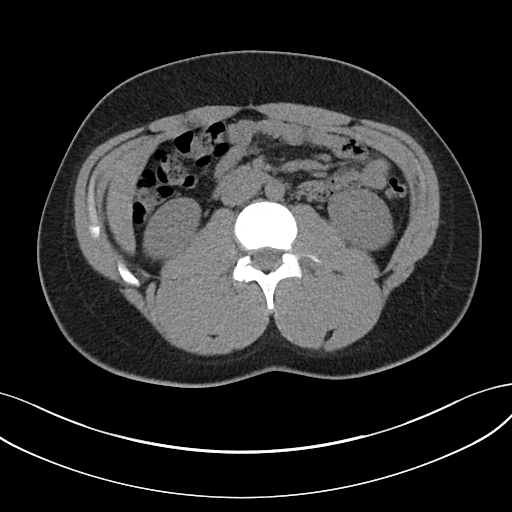
[im 67/91  soft-tissue]
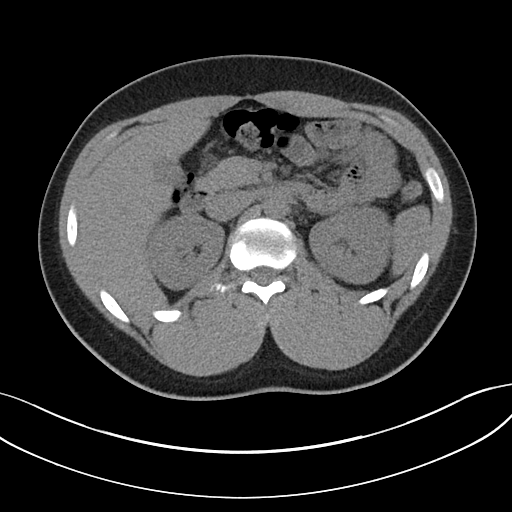
[im 75/91  soft-tissue]
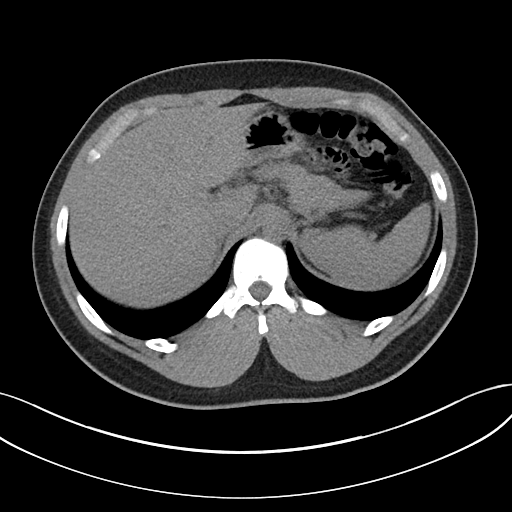
[im 79/91  soft-tissue]
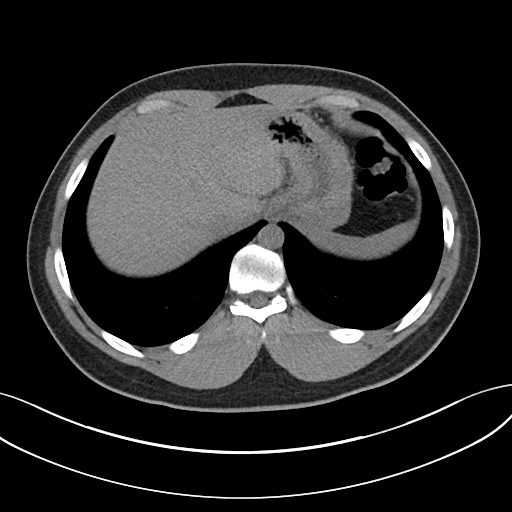
[im 87/91  soft-tissue]
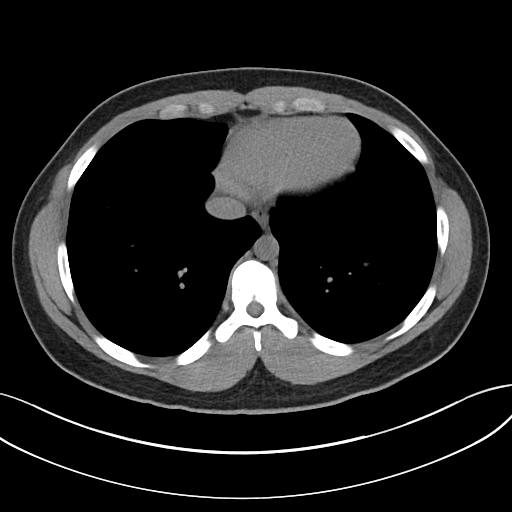

[Series 6: renal stone 3.0 cor · coronal · 0.85mm/px · 3 of 101 slices shown]
[im 34/101  soft-tissue]
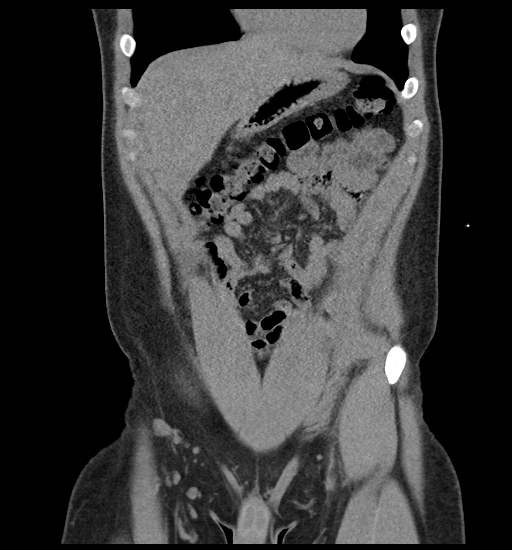
[im 45/101  soft-tissue]
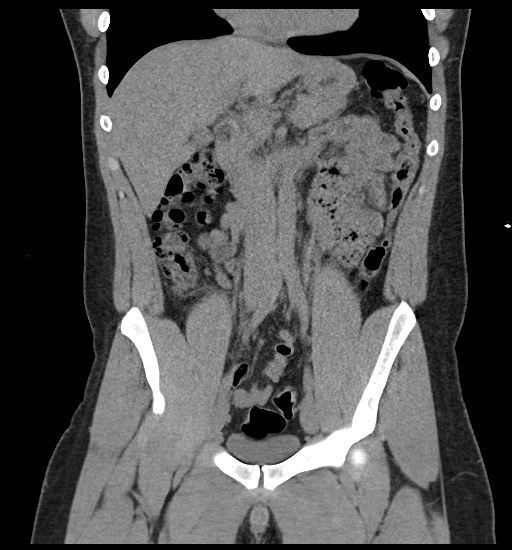
[im 56/101  soft-tissue]
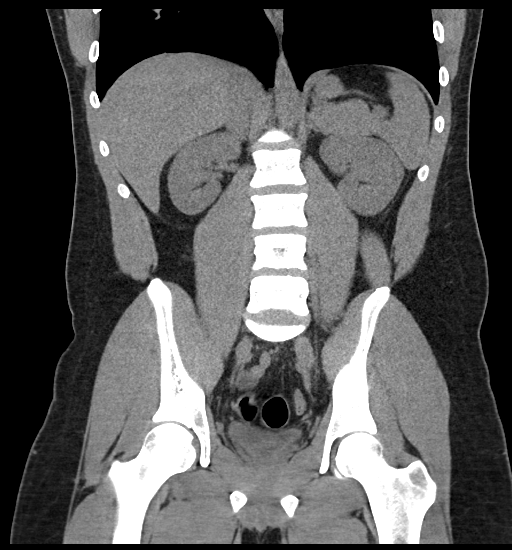

[17 of 46 positions shown; findings below may reference images not displayed]

FINDINGS: Lower chest: No acute abnormality.

Evaluation of the abdominal parenchyma is somewhat limited by the
lack of IV contrast.

Hepatobiliary: No focal liver abnormality is seen. No gallstones,
gallbladder wall thickening, or biliary dilatation.

Pancreas: Unremarkable

Spleen: Normal in size without focal abnormality.

Adrenals/Urinary Tract: Adrenal glands are unremarkable. Kidneys are
normal in size. No renal calculi. No hydronephrosis. Bladder is
unremarkable.

Stomach/Bowel: Stomach is within normal limits. Appendix appears
normal. No evidence of bowel wall thickening, distention, or
inflammatory changes.

Vascular/Lymphatic: No significant vascular findings are present. No
enlarged abdominal or pelvic lymph nodes.

Reproductive: Prostate is unremarkable.

Other: No abdominal wall hernia or abnormality. No abdominopelvic
ascites.

Musculoskeletal: No acute or significant osseous findings.
IMPRESSION: No evidence of renal calculi or other finding to explain the
patient's abdominal pain on a noncontrast CT.

## 2021-10-30 IMAGING — CR DG CHEST 2V
2 series · 2 of 2 positions shown · non-contrast
Comparison: Radiograph 4 days ago 04/22/2019

CLINICAL DATA: Chest pain and shortness of breath.

EXAM:
CHEST - 2 VIEW

[chest pa]
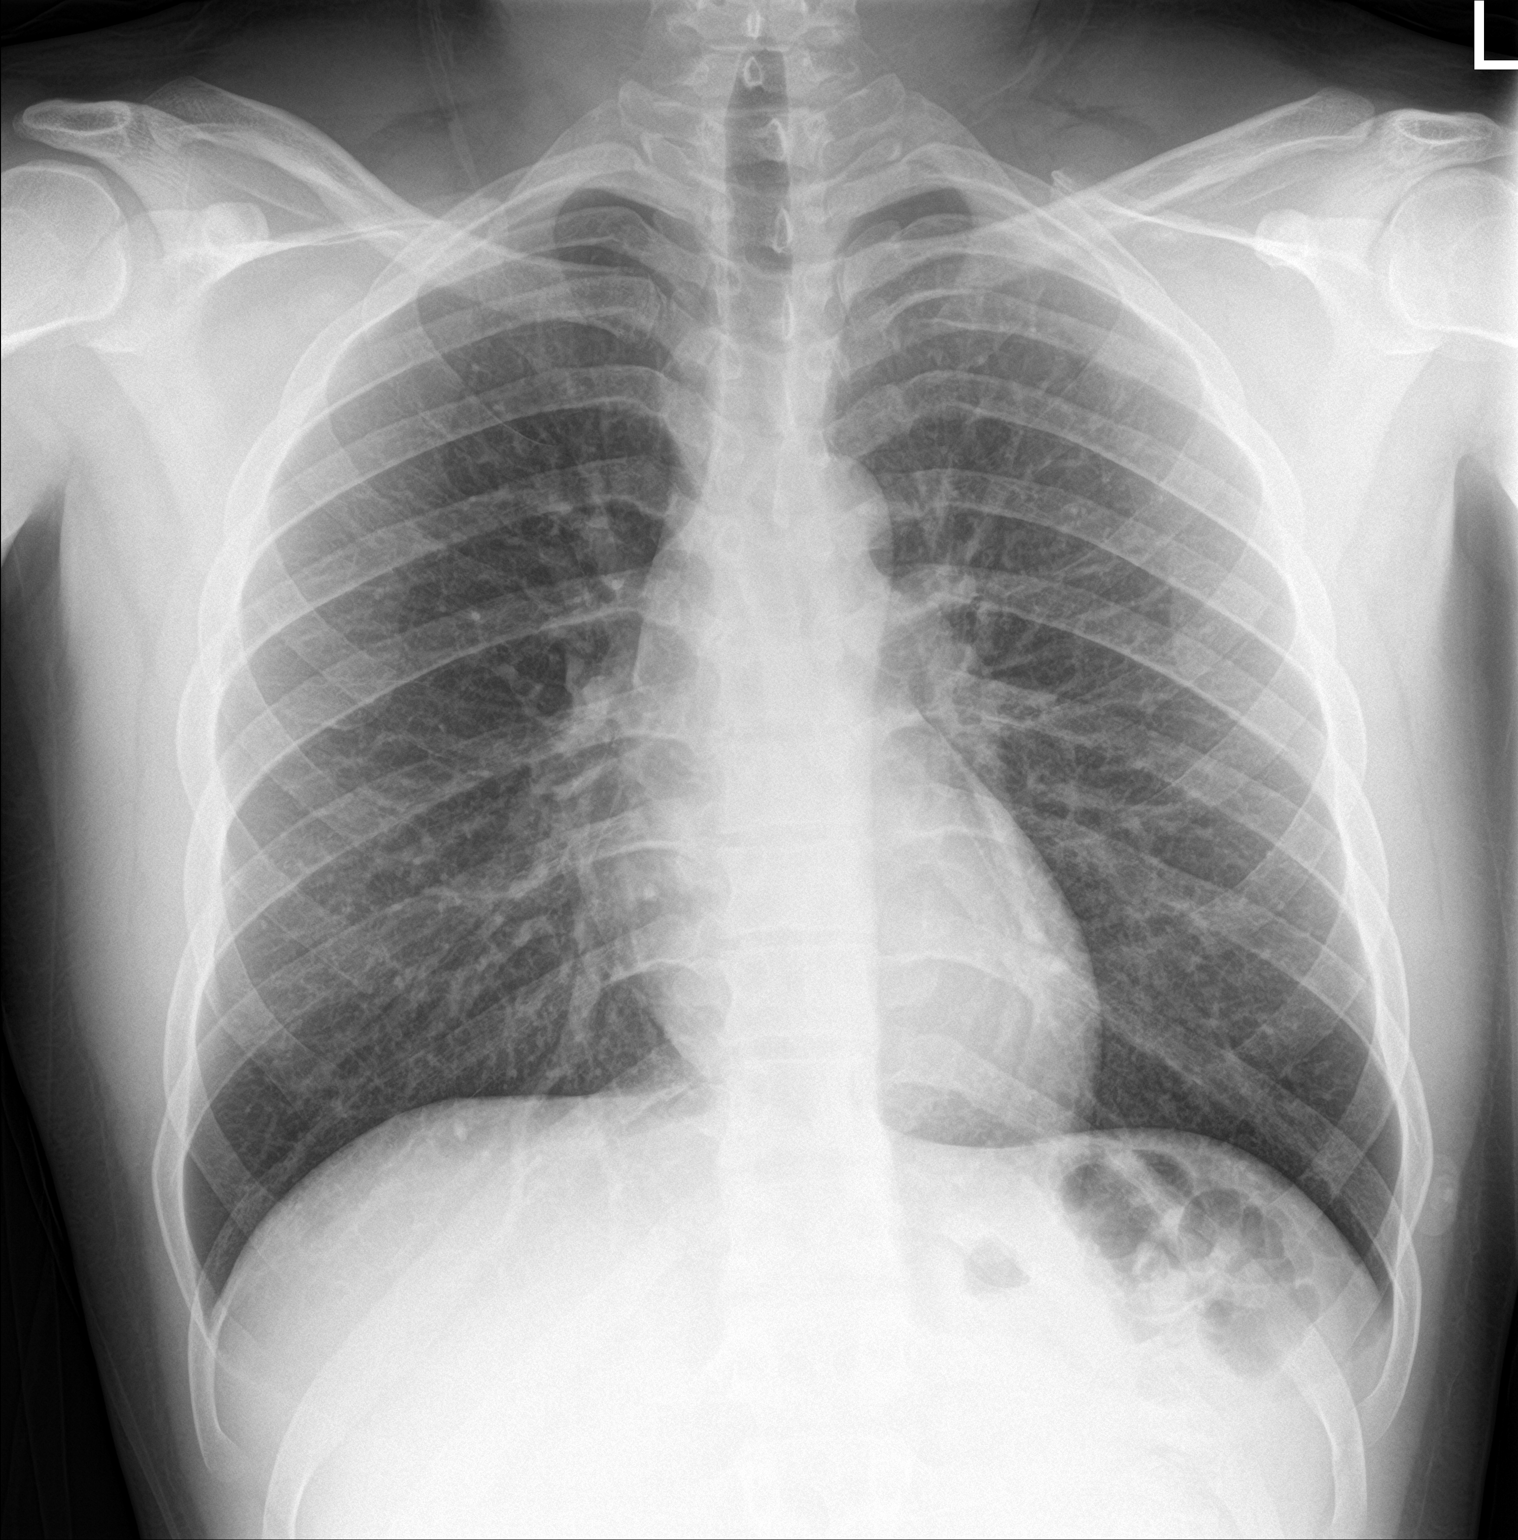

[chest lat]
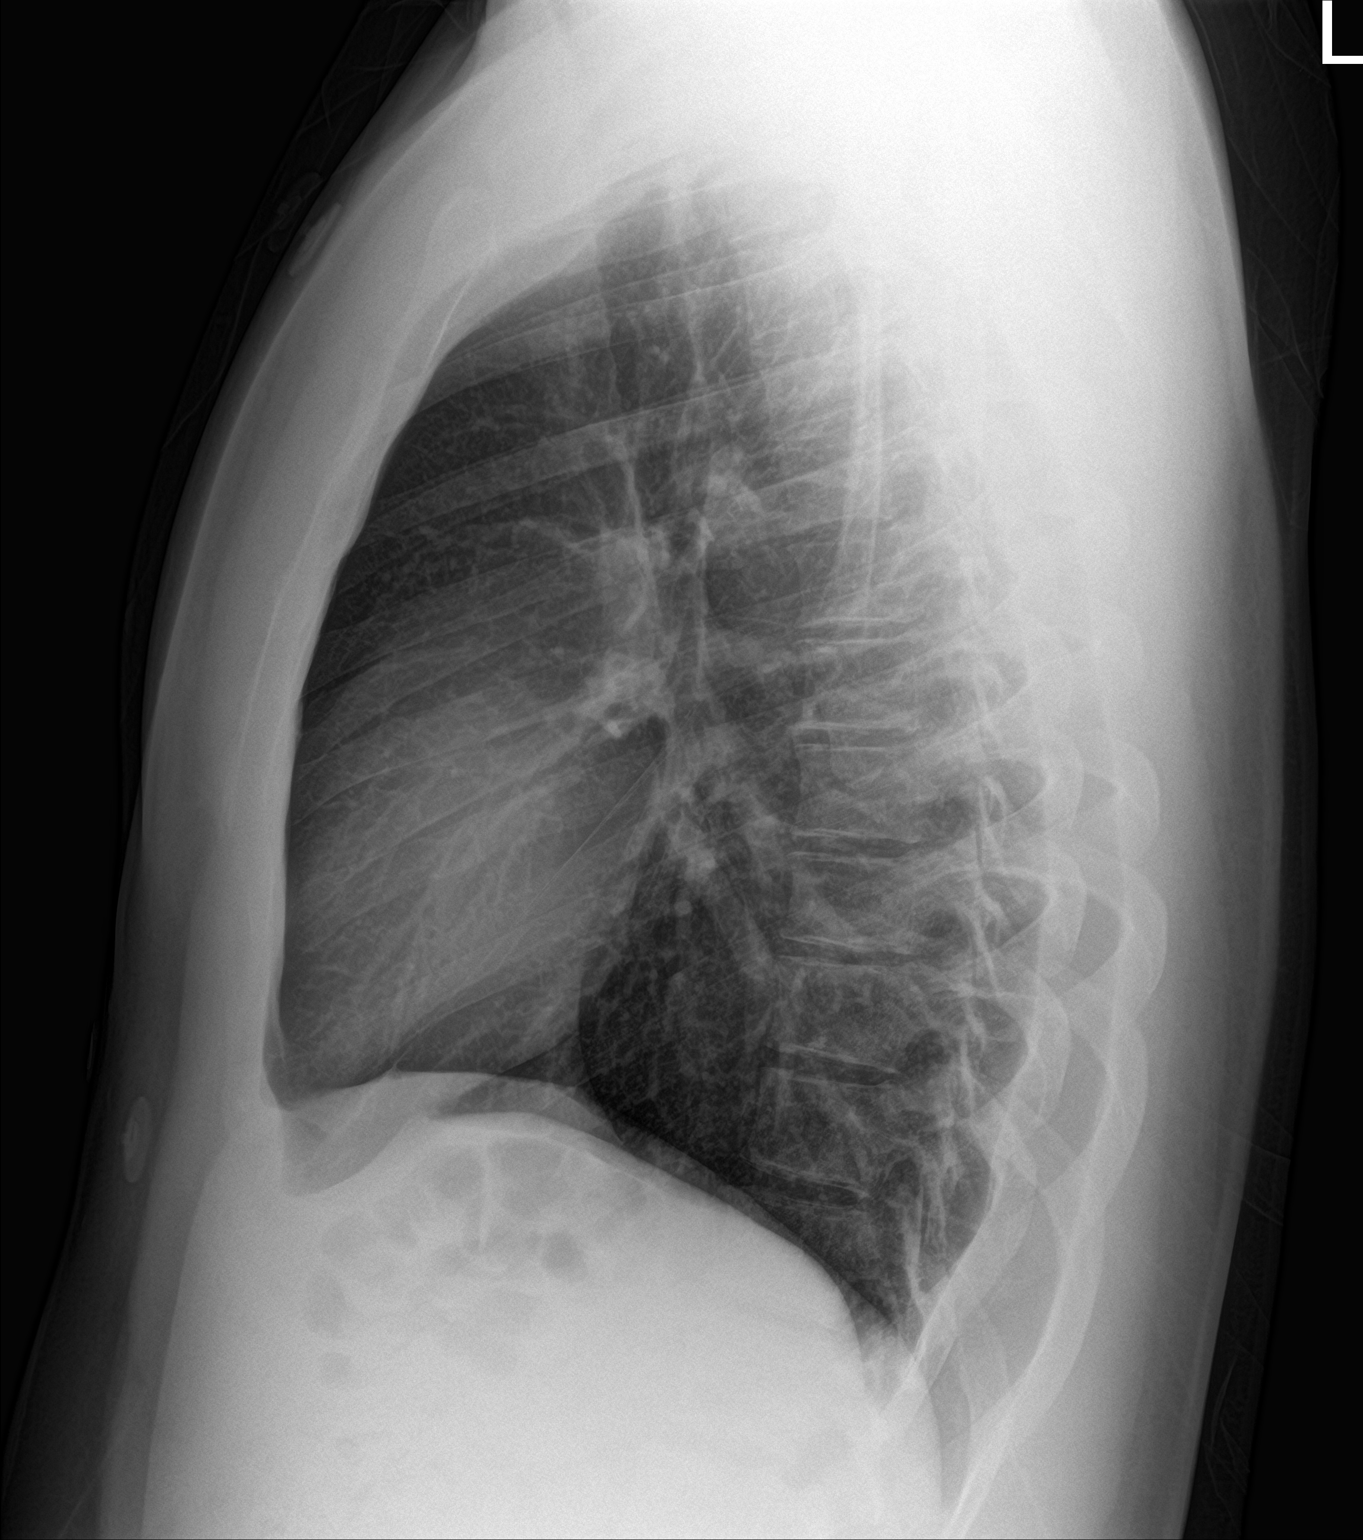

[2 of 2 positions shown; findings below may reference images not displayed]

FINDINGS: The cardiomediastinal contours are normal. The lungs are clear.
Pulmonary vasculature is normal. No consolidation, pleural effusion,
or pneumothorax. No acute osseous abnormalities are seen.
IMPRESSION: Normal radiographs of the chest.

## 2021-11-02 IMAGING — CR DG CHEST 2V
2 series · 2 of 2 positions shown · non-contrast
Comparison: 04/26/2019

CLINICAL DATA: Chest pain, shortness of breath

EXAM:
CHEST - 2 VIEW

[chest pa]
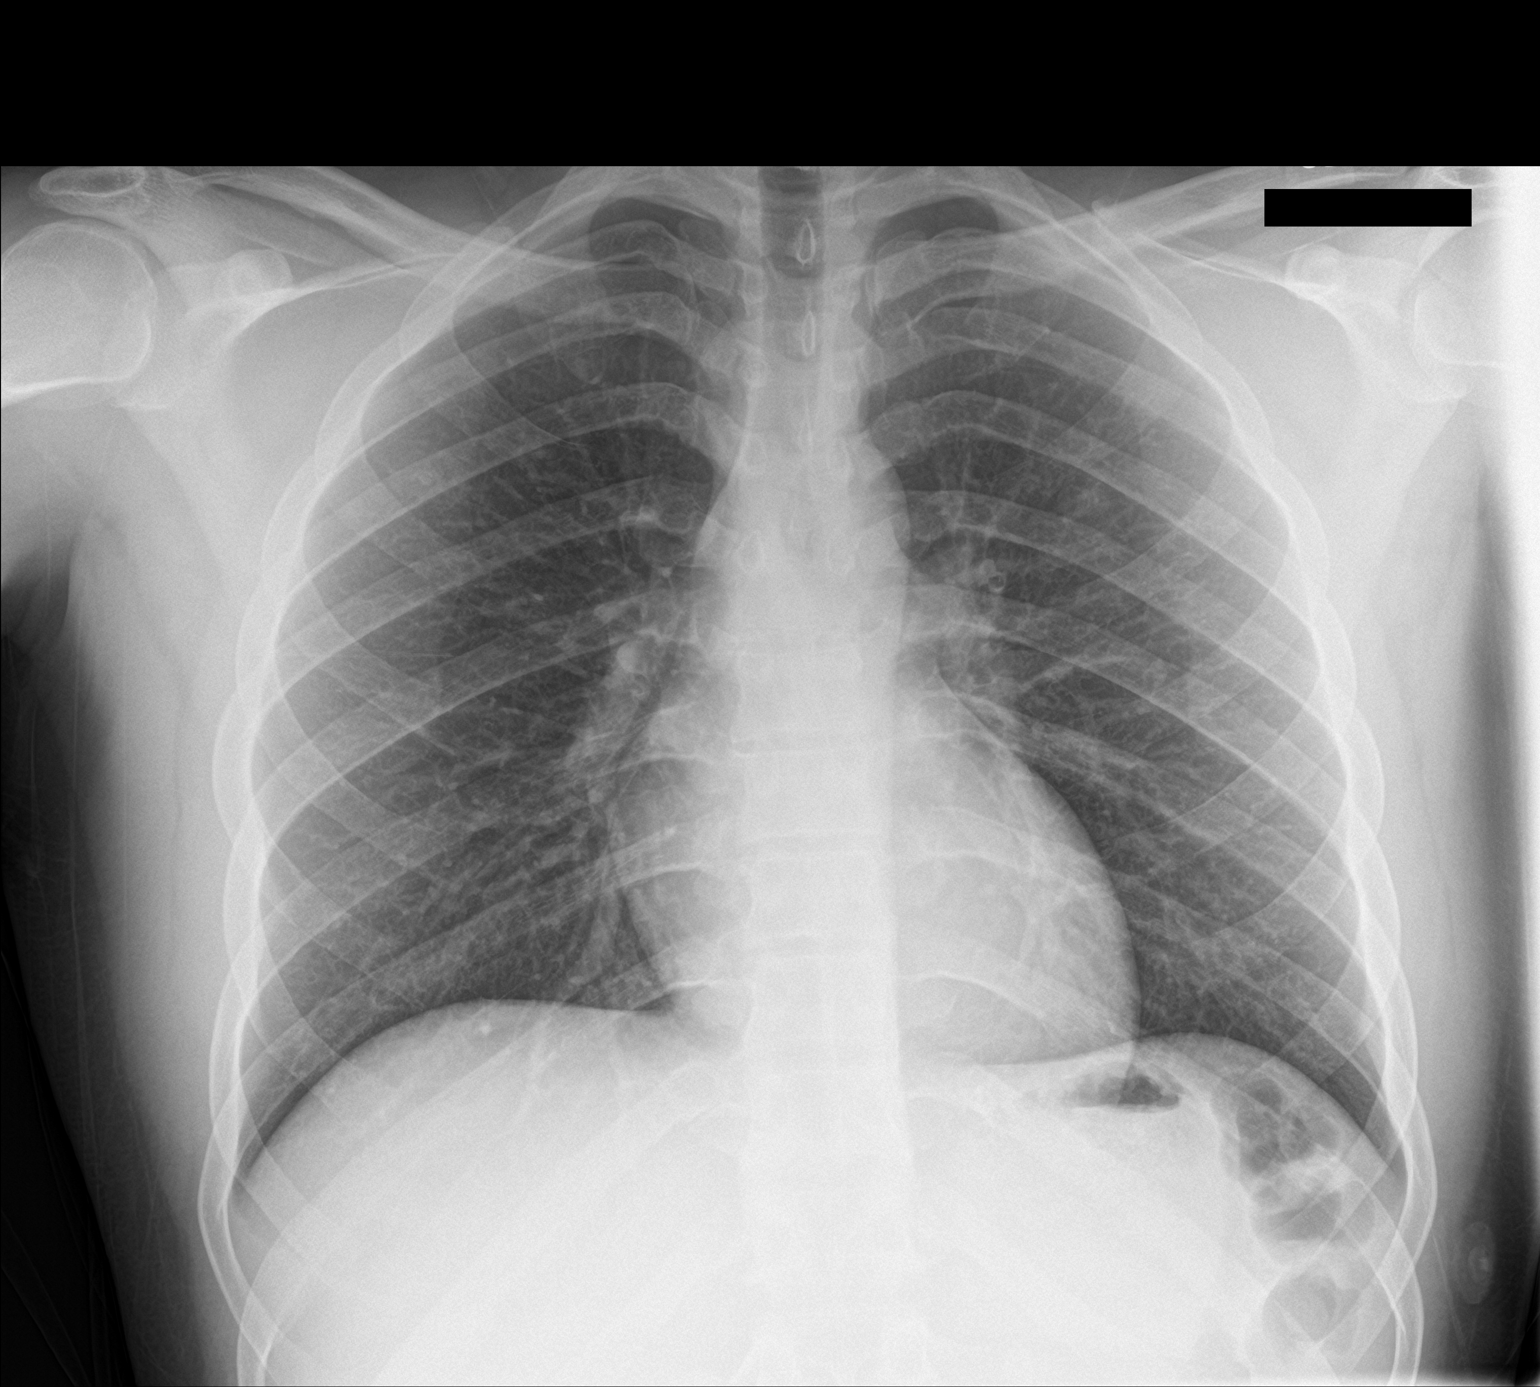

[chest lat]
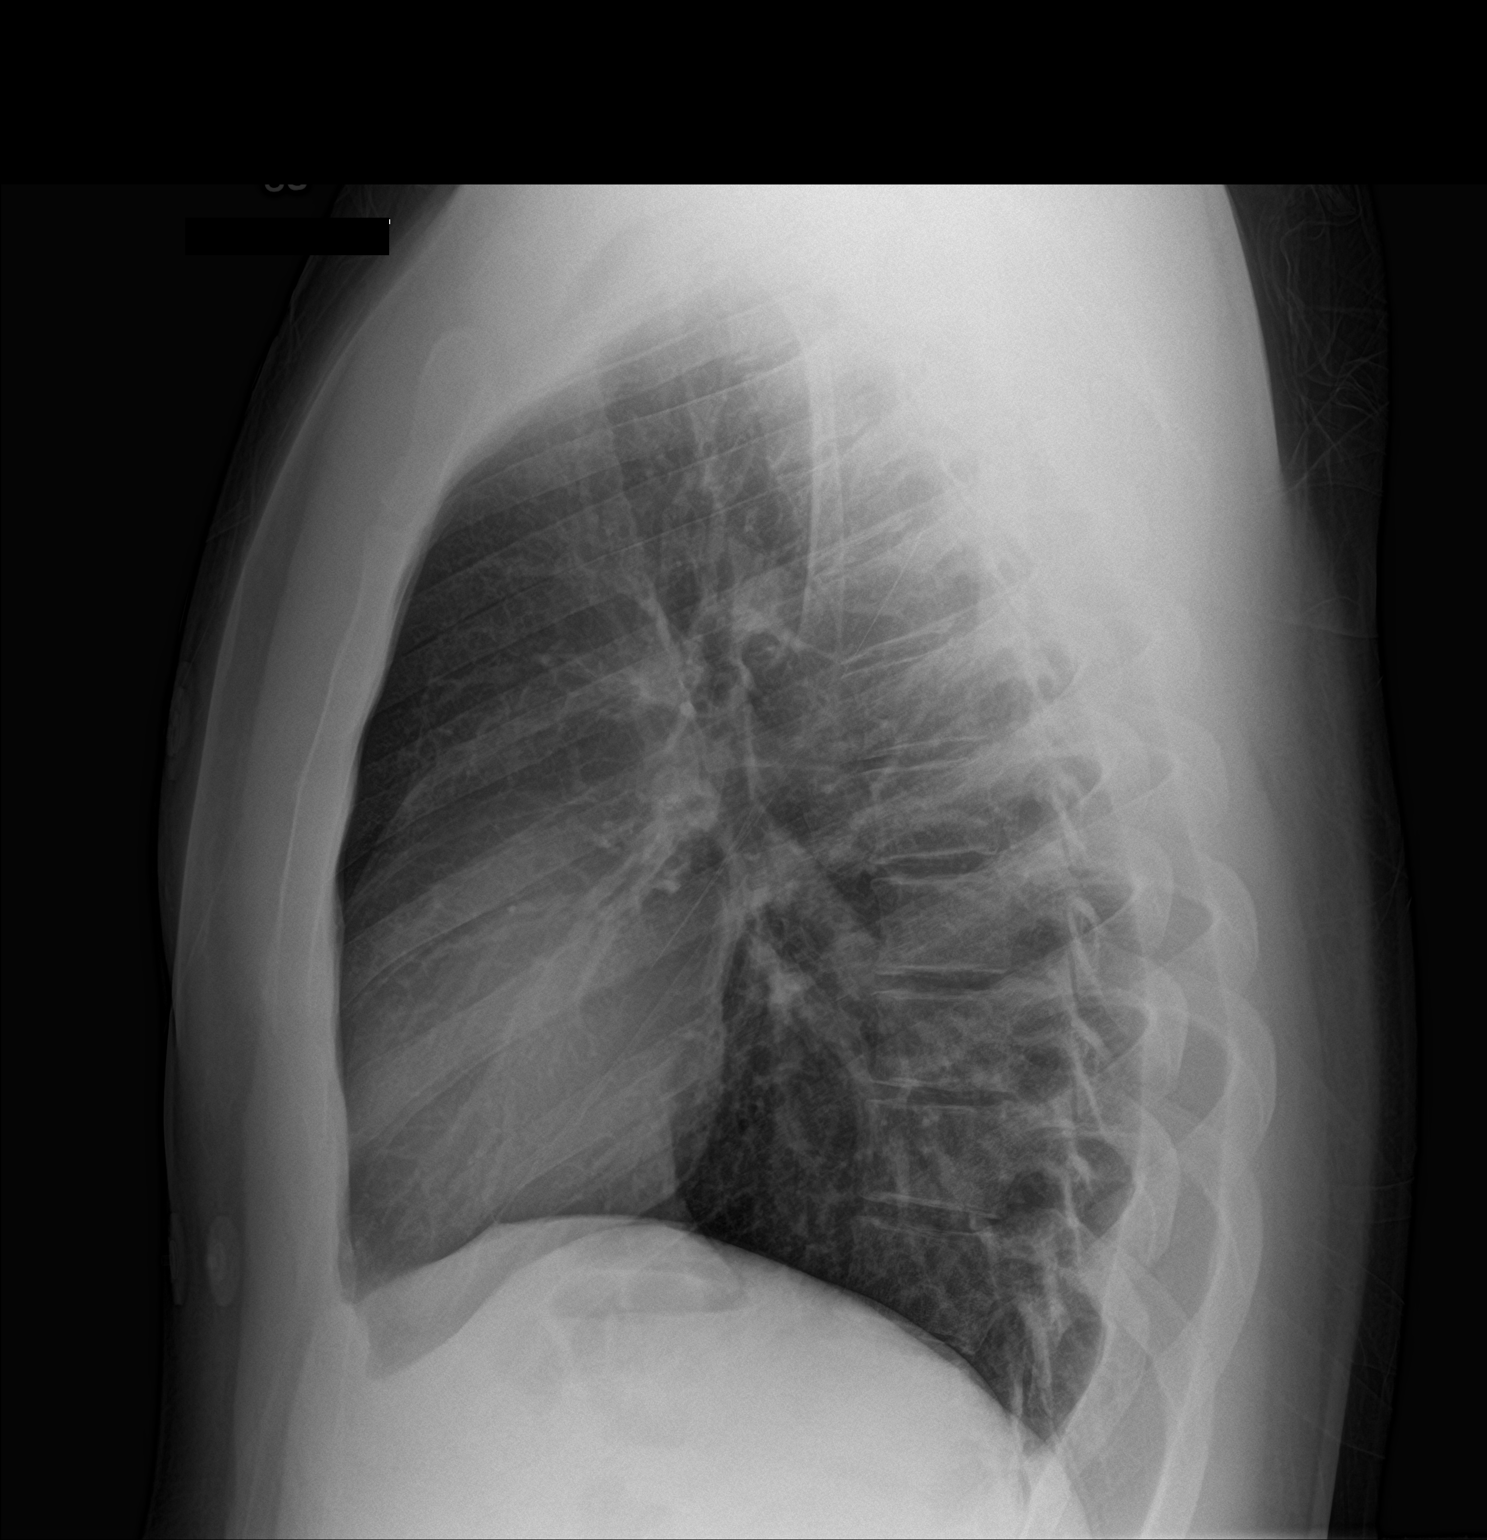

[2 of 2 positions shown; findings below may reference images not displayed]

FINDINGS: The heart size and mediastinal contours are within normal limits.
Both lungs are clear. The visualized skeletal structures are
unremarkable.
IMPRESSION: No active cardiopulmonary disease.

## 2022-02-17 ENCOUNTER — Other Ambulatory Visit: Payer: Self-pay

## 2022-02-17 ENCOUNTER — Encounter (HOSPITAL_BASED_OUTPATIENT_CLINIC_OR_DEPARTMENT_OTHER): Payer: Self-pay | Admitting: Emergency Medicine

## 2022-02-17 ENCOUNTER — Emergency Department (HOSPITAL_BASED_OUTPATIENT_CLINIC_OR_DEPARTMENT_OTHER)
Admission: EM | Admit: 2022-02-17 | Discharge: 2022-02-17 | Disposition: A | Discharge status: Home / Self Care | Payer: BLUE CROSS/BLUE SHIELD | Attending: Emergency Medicine | Admitting: Emergency Medicine

## 2022-02-17 DIAGNOSIS — Z7952 Long term (current) use of systemic steroids: Secondary | ICD-10-CM | POA: Insufficient documentation

## 2022-02-17 DIAGNOSIS — Z87891 Personal history of nicotine dependence: Secondary | ICD-10-CM | POA: Insufficient documentation

## 2022-02-17 DIAGNOSIS — J45909 Unspecified asthma, uncomplicated: Secondary | ICD-10-CM | POA: Insufficient documentation

## 2022-02-17 DIAGNOSIS — Z20822 Contact with and (suspected) exposure to covid-19: Secondary | ICD-10-CM | POA: Insufficient documentation

## 2022-02-17 DIAGNOSIS — Z7951 Long term (current) use of inhaled steroids: Secondary | ICD-10-CM | POA: Insufficient documentation

## 2022-02-17 DIAGNOSIS — Z8616 Personal history of COVID-19: Secondary | ICD-10-CM | POA: Insufficient documentation

## 2022-02-17 DIAGNOSIS — J069 Acute upper respiratory infection, unspecified: Secondary | ICD-10-CM | POA: Insufficient documentation

## 2022-02-17 LAB — GROUP A STREP BY PCR: Group A Strep by PCR: NOT DETECTED

## 2022-02-17 LAB — RESP PANEL BY RT-PCR (FLU A&B, COVID) ARPGX2
Influenza A by PCR: NEGATIVE
Influenza B by PCR: NEGATIVE
SARS Coronavirus 2 by RT PCR: NEGATIVE

## 2022-02-17 MED ORDER — FLUTICASONE PROPIONATE 50 MCG/ACT NA SUSP
2.0000 | Freq: Once | NASAL | Status: AC
  Administered 2022-02-17: 2 via NASAL
  Filled 2022-02-17: qty 16

## 2022-02-17 MED ORDER — IBUPROFEN 400 MG PO TABS
600.0000 mg | ORAL_TABLET | Freq: Once | ORAL | Status: AC
  Administered 2022-02-17: 600 mg via ORAL
  Filled 2022-02-17: qty 1

## 2022-02-17 MED ORDER — CETIRIZINE HCL 5 MG/5ML PO SOLN
5.0000 mg | Freq: Once | ORAL | Status: AC
  Administered 2022-02-17: 5 mg via ORAL
  Filled 2022-02-17: qty 5

## 2022-02-17 NOTE — ED Provider Notes (Signed)
[facility] EMERGENCY DEPT Provider Note   CSN: [id number] Arrival date & time: 02/17/22  1138     History  Chief Complaint  Patient presents with   URI    Todd Barajas is a 26 y.o. male.   URI   26 year old male presents emergency department with complaints of cough, itchy throat, headache, runny nose.  Patient states that symptoms been present since yesterday.  He notes similar symptoms last time he had COVID approximately 2 months ago.  His girlfriend has recently been sick with the same symptoms but did not test for COVID or flu.  He notes slight increase in symptoms today.  Reports headache is gradual in onset and frontal in nature.  He is taken no medicine for this.  Denies visual deficits, slurred speech, facial droop, weakness/sensory deficits, changes in balance/gait, blood thinner use.  Denies fever, chills, night sweats, chest pain, shortness of breath, abdominal pain, nausea, vomiting, urinary symptoms, change in bowel habits.  Past medical history significant for asthma which patient says he has not treated in some time and feels as if he has "grown out of it."  Home Medications Prior to Admission medications   Medication Sig Start Date End Date Taking? Authorizing Provider  amoxicillin-clavulanate (AUGMENTIN) 875-125 MG tablet Take 1 tablet by mouth every 12 (twelve) hours. 05/22/19   Corena Herter, PA-C  diphenhydrAMINE (BENADRYL) 25 MG tablet Take 1 tablet (25 mg total) by mouth every 6 (six) hours as needed. 08/16/19   Suzy Bouchard, PA-C  famotidine (PEPCID) 40 MG tablet Take 1 tablet (40 mg total) by mouth daily. 08/16/19   Suzy Bouchard, PA-C  fluticasone (FLONASE) 50 MCG/ACT nasal spray Place 2 sprays into both nostrils daily for 21 days. 05/22/19 06/12/19  Corena Herter, PA-C  hydrOXYzine (ATARAX/VISTARIL) 25 MG tablet Take 1 tablet (25 mg total) by mouth every 6 (six) hours as needed. [id number]   Delora Fuel, MD  naproxen sodium  (ALEVE) 220 MG tablet Take 220 mg by mouth as needed.    [provider]  pantoprazole (PROTONIX) 20 MG tablet TAKE 1 TABLET BY MOUTH EVERY DAY 06/17/19   Julian Hy, DO  permethrin (ELIMITE) 5 % cream Apply to affected area once 11/04/19   Khatri, Hina, PA-C  predniSONE (DELTASONE) 50 MG tablet Take 1 tablet (50 mg total) by mouth daily. [id number]   Delora Fuel, MD  triamcinolone cream (KENALOG) 0.1 % Apply 1 application topically 2 (two) times daily. 10/14/19   Couture, Cortni S, PA-C      Allergies    Patient has no known allergies.    Review of Systems   Review of Systems  All other systems reviewed and are negative.   Physical Exam Updated Vital Signs BP (!) 144/98 (BP Location: Right Arm)   Pulse 79   Temp 98.3 F (36.8 C) (Oral)   Resp 16   SpO2 98%  Physical Exam Vitals and nursing note reviewed.  Constitutional:      General: He is not in acute distress.    Appearance: He is well-developed.  HENT:     Head: Normocephalic and atraumatic.     Nose: Congestion and rhinorrhea present.     Mouth/Throat:     Comments: Mild posterior pharyngeal erythema noted.  Tonsils 0-1+ bilaterally with mild exudate noted bilaterally.  Uvula midline rises symmetrically with phonation. Eyes:     Conjunctiva/sclera: Conjunctivae normal.  Cardiovascular:     Rate and Rhythm: Normal rate and regular  rhythm.     Heart sounds: No murmur heard. Pulmonary:     Effort: Pulmonary effort is normal. No respiratory distress.     Breath sounds: Normal breath sounds. No stridor. No wheezing or rales.  Abdominal:     Palpations: Abdomen is soft.     Tenderness: There is no abdominal tenderness.  Musculoskeletal:        General: No swelling.     Cervical back: Neck supple. No rigidity or tenderness.  Skin:    General: Skin is warm and dry.     Capillary Refill: Capillary refill takes less than 2 seconds.  Neurological:     Mental Status: He is alert.     Comments: Alert and oriented  to self, place, time and event.   Speech is fluent, clear without dysarthria or dysphasia.   Strength 5/5 in upper/lower extremities   Sensation intact in upper/lower extremities   Normal gait.  Negative Romberg. No pronator drift.  Normal finger-to-nose and feet tapping.  CN I not tested  CN II grossly intact visual fields bilaterally. Did not visualize posterior eye.  CN III, IV, VI PERRLA and EOMs intact bilaterally  CN V Intact sensation to sharp and light touch to the face  CN VII facial movements symmetric  CN VIII not tested  CN IX, X no uvula deviation, symmetric rise of soft palate  CN XI 5/5 SCM and trapezius strength bilaterally  CN XII Midline tongue protrusion, symmetric L/R movements    Psychiatric:        Mood and Affect: Mood normal.     ED Results / Procedures / Treatments   Labs (all labs ordered are listed, but only abnormal results are displayed) Labs Reviewed  RESP PANEL BY RT-PCR (FLU A&B, COVID) [id number]  GROUP A STREP BY PCR    EKG None  Radiology No results found.  Procedures Procedures    Medications Ordered in ED Medications  cetirizine HCl (Zyrtec) 5 MG/5ML solution 5 mg (5 mg Oral Given 02/17/22 1210)  fluticasone (FLONASE) 50 MCG/ACT nasal spray 2 spray (2 sprays Each Nare Given 02/17/22 1210)  ibuprofen (ADVIL) tablet 600 mg (600 mg Oral Given 02/17/22 1213)    ED Course/ Medical Decision Making/ A&P                           Medical Decision Making  This patient presents to the ED for concern of URI, this involves an extensive number of treatment options, and is a complaint that carries with it a high risk of complications and morbidity.  The differential diagnosis includes COVID, influenza, viral URI, pneumonia, meningitis   Co morbidities that complicate the patient evaluation  See HPI   Additional history obtained:  Additional history obtained from EMR External records from outside source obtained and reviewed  including hospital records   Lab Tests:  I Ordered, and personally interpreted labs.  The pertinent results include: Group A strep negative.  Respiratory viral panel: Negative for COVID/influenza   Imaging Studies ordered:  N/a   Cardiac Monitoring: / EKG:  The patient was maintained on a cardiac monitor.  I personally viewed and interpreted the cardiac monitored which showed an underlying rhythm of: Sinus rhythm   Consultations Obtained:  N/a   Problem List / ED Course / Critical interventions / Medication management  URI I ordered medication including Zyrtec, Flonase and Motrin   Reevaluation of the patient after these medicines showed that the  patient improved I have reviewed the patients home medicines and have made adjustments as needed   Social Determinants of Health:  Former cigarette use.  Denies illicit drug use.   Test / Admission - Considered:  URI Vitals signs significant for mild hypertension with blood pressure 144/98.  Recommend close follow-up with PCP regarding elevation of blood pressure.. Otherwise within normal range and stable throughout visit. Laboratory/imaging studies significant for: See above Patient symptoms likely secondary to URI.  Doubt pneumonia, meningitis, COVID, influenza.  Symptomatic therapy recommended with daily antihistamine, Tylenol/ibuprofen as needed for pain/headache, Flonase for nasal congestion.  Recommend follow-up with PCP in 3 to 5 days.  Treatment plan discussed with patient and he acknowledged understand was agreeable to said plan. Worrisome signs and symptoms were discussed with the patient, and the patient acknowledged understanding to return to the ED if noticed. Patient was stable upon discharge.          Final Clinical Impression(s) / ED Diagnoses Final diagnoses:  Upper respiratory tract infection, unspecified type    Rx / DC Orders ED Discharge Orders     None         Wilnette Kales,  [place] 02/17/22 1320    Lajean Saver, MD 02/17/22 1332

## 2022-02-17 NOTE — Discharge Instructions (Addendum)
Note that your work-up today was overall reassuring.  You tested negative for strep, flu and COVID.  Recommend continued symptomatic therapy outpatient with daily antihistamine such as Zyrtec or Claritin, nasal steroid such as Flonase/Nasacort, ibuprofen/Tylenol as needed for headache.  Recommend follow-up with your PCP in 3 to 5 days for reevaluation of your symptoms.  Please do not hesitate to return to the emergency department for worrisome signs and symptoms we discussed become apparent.

## 2022-02-17 NOTE — ED Triage Notes (Signed)
Runny nose,itchy throat yesterday, today feels worse. Headache 6/10, runny nose .throat is sore, small cough.

## 2022-07-03 ENCOUNTER — Other Ambulatory Visit: Payer: Self-pay

## 2022-07-03 ENCOUNTER — Emergency Department (HOSPITAL_BASED_OUTPATIENT_CLINIC_OR_DEPARTMENT_OTHER)
Admission: EM | Admit: 2022-07-03 | Discharge: 2022-07-03 | Disposition: A | Discharge status: Home / Self Care | Payer: Self-pay | Attending: Emergency Medicine | Admitting: Emergency Medicine

## 2022-07-03 DIAGNOSIS — Z7951 Long term (current) use of inhaled steroids: Secondary | ICD-10-CM | POA: Insufficient documentation

## 2022-07-03 DIAGNOSIS — S161XXA Strain of muscle, fascia and tendon at neck level, initial encounter: Secondary | ICD-10-CM | POA: Insufficient documentation

## 2022-07-03 DIAGNOSIS — J45909 Unspecified asthma, uncomplicated: Secondary | ICD-10-CM | POA: Insufficient documentation

## 2022-07-03 DIAGNOSIS — X58XXXA Exposure to other specified factors, initial encounter: Secondary | ICD-10-CM | POA: Insufficient documentation

## 2022-07-03 MED ORDER — LIDOCAINE 5 % EX PTCH: 1.0000 | MEDICATED_PATCH | CUTANEOUS | 0 refills | Status: DC

## 2022-07-03 MED ORDER — CYCLOBENZAPRINE HCL 10 MG PO TABS: 10.0000 mg | ORAL_TABLET | Freq: Two times a day (BID) | ORAL | 0 refills | Status: DC | PRN

## 2022-07-03 MED ORDER — PREDNISONE 10 MG PO TABS: 40.0000 mg | ORAL_TABLET | Freq: Every day | ORAL | 0 refills | Status: AC

## 2022-07-03 NOTE — Discharge Instructions (Addendum)
For your pain, you may take up to '1000mg'$  of acetaminophen (tylenol) 4 times daily for up to a week. This is the maximum dose of acetminophen (tylenol) you can take from all sources. Please check other over-the-counter medications and prescriptions to ensure you are not taking other medications that contain acetaminophen.  You may also take ibuprofen 400 mg 6 times a day OR '600mg'$  4 times a day alternating with or at the same time as tylenol.  You may take these medications at the same time as the prescribed muscle relaxant and lidoderm patch. The muscle relaxant will make you sleepy.

## 2022-07-03 NOTE — ED Triage Notes (Signed)
Patient arrives with complaints of lower neck pain. Patient states that he bent his neck this morning and there was a sudden going up from his neck to the back of his head. Rates pain a 7/10.

## 2022-07-03 NOTE — ED Provider Notes (Signed)
[facility] Provider Note   CSN: [id number] Arrival date & time: 07/03/22  1118     History  Chief Complaint  Patient presents with   Neck Pain    Todd Barajas is a 27 y.o. male.  HPI     27 year old male with a history of asthma presents with concern for neck pain.  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  Yesterday after work he did not notice any pain, however today when he was flexing his neck starting to put his hair in a ponytail, he felt sudden pain and tightness to his neck.  He dropped his arms down.  Since then he has had a difficult time moving his head without pain.  Has stiffness of his neck and feels he is unable to turn it given severity of pain with movement.  The pain is worse on the right side.  Reports it began at the base of his neck and feels some right sided tightness and when he attempts to move his head feels pain in that area as well.  Denies any chest pain, numbness, weakness, headache.  Has not had pain like this before.  Initially he thought the pain would improve, but it has continued since early this morning and came in for evaluation.  Denies any other falls, trauma, fever or IV drug use.   Past Medical History:  Diagnosis Date   Asthma     Home Medications Prior to Admission medications   Medication Sig Start Date End Date Taking? Authorizing Provider  cyclobenzaprine (FLEXERIL) 10 MG tablet Take 1 tablet (10 mg total) by mouth 2 (two) times daily as needed for muscle spasms. 07/03/22  Yes Gareth Morgan, MD  lidocaine (LIDODERM) 5 % Place 1 patch onto the skin daily. Remove & Discard patch within 12 hours or as directed by MD 07/03/22  Yes Gareth Morgan, MD  predniSONE (DELTASONE) 10 MG tablet Take 4 tablets (40 mg total) by mouth daily for 4 days. 07/03/22 07/07/22 Yes Gareth Morgan, MD  amoxicillin-clavulanate (AUGMENTIN)  875-125 MG tablet Take 1 tablet by mouth every 12 (twelve) hours. 05/22/19   Corena Herter, PA-C  diphenhydrAMINE (BENADRYL) 25 MG tablet Take 1 tablet (25 mg total) by mouth every 6 (six) hours as needed. 08/16/19   Suzy Bouchard, PA-C  famotidine (PEPCID) 40 MG tablet Take 1 tablet (40 mg total) by mouth daily. 08/16/19   Suzy Bouchard, PA-C  fluticasone (FLONASE) 50 MCG/ACT nasal spray Place 2 sprays into both nostrils daily for 21 days. 05/22/19 06/12/19  Corena Herter, PA-C  hydrOXYzine (ATARAX/VISTARIL) 25 MG tablet Take 1 tablet (25 mg total) by mouth every 6 (six) hours as needed. [id number]   Delora Fuel, MD  naproxen sodium (ALEVE) 220 MG tablet Take 220 mg by mouth as needed.    [provider]  pantoprazole (PROTONIX) 20 MG tablet TAKE 1 TABLET BY MOUTH EVERY DAY 06/17/19   Julian Hy, DO  permethrin (ELIMITE) 5 % cream Apply to affected area once 11/04/19   Khatri, Hina, PA-C  triamcinolone cream (KENALOG) 0.1 % Apply 1 application topically 2 (two) times daily. 10/14/19   Couture, Cortni S, PA-C      Allergies    Patient has no known allergies.    Review of Systems   Review of Systems  Physical Exam Updated Vital Signs BP 123/76 (BP Location: Right  Arm)   Pulse (!) 55   Temp 97.9 F (36.6 C) (Oral)   Resp 14   Ht '5\' 11"'$  (1.803 m)   Wt 97.5 kg   SpO2 100%   BMI 29.99 kg/m  Physical Exam Vitals and nursing note reviewed.  Constitutional:      General: He is not in acute distress.    Appearance: He is well-developed. He is not diaphoretic.  HENT:     Head: Normocephalic and atraumatic.  Eyes:     Conjunctiva/sclera: Conjunctivae normal.  Cardiovascular:     Rate and Rhythm: Normal rate and regular rhythm.     Pulses: Normal pulses.  Pulmonary:     Effort: Pulmonary effort is normal. No respiratory distress.  Abdominal:     General: There is no distension.     Palpations: Abdomen is soft.     Tenderness: There is no abdominal tenderness.  There is no guarding.  Musculoskeletal:        General: Tenderness (right trapezius, right side of neck, pain with ROM) present.     Cervical back: Normal range of motion.  Skin:    General: Skin is warm and dry.  Neurological:     Mental Status: He is alert and oriented to person, place, and time.     Sensory: No sensory deficit.     Motor: No weakness.     ED Results / Procedures / Treatments   Labs (all labs ordered are listed, but only abnormal results are displayed) Labs Reviewed - No data to display  EKG None  Radiology No results found.  Procedures Procedures    Medications Ordered in ED Medications - No data to display  ED Course/ Medical Decision Making/ A&P                         27 year old male with a history of asthma presents with concern for neck pain.  Presents with neck pain and stiffness worsened by movements. Has some right trapeziusm pain as well. Low suspicion for aortic dissection given normal bilateral pulses, normal blood pressures, pain exacerbated by neck movements. Doubt SAH, ACS by hx and exam. NO history of trauma and doubt fracture.  No numbness, weakness and doubt acute cervical stenosis that would require emergent surgery.  Suspect pain secondary to cervical strain or disc herniation.  Recommend activity as tolerated, supportive care, PCP follow up.  Given rx for prednisone, lidoderm patch, flexeril and recommend tylenol/ibuprofen and light duty at work for one week or less as tolerated. Patient discharged in stable condition with understanding of reasons to return.           Final Clinical Impression(s) / ED Diagnoses Final diagnoses:  Strain of neck muscle, initial encounter    Rx / DC Orders ED Discharge Orders          Ordered    cyclobenzaprine (FLEXERIL) 10 MG tablet  2 times daily PRN        07/03/22 1234    lidocaine (LIDODERM) 5 %  Every 24 hours        07/03/22 1234    predniSONE (DELTASONE) 10 MG tablet  Daily         07/03/22 1235              Gareth Morgan, MD 07/03/22 1304

## 2022-07-04 ENCOUNTER — Encounter (HOSPITAL_BASED_OUTPATIENT_CLINIC_OR_DEPARTMENT_OTHER): Payer: Self-pay

## 2022-07-04 ENCOUNTER — Emergency Department (HOSPITAL_BASED_OUTPATIENT_CLINIC_OR_DEPARTMENT_OTHER)
Admission: EM | Admit: 2022-07-04 | Discharge: 2022-07-04 | Disposition: A | Discharge status: Home / Self Care | Payer: Self-pay | Attending: Emergency Medicine | Admitting: Emergency Medicine

## 2022-07-04 ENCOUNTER — Other Ambulatory Visit: Payer: Self-pay

## 2022-07-04 DIAGNOSIS — Z0279 Encounter for issue of other medical certificate: Secondary | ICD-10-CM | POA: Insufficient documentation

## 2022-07-04 DIAGNOSIS — Z7689 Persons encountering health services in other specified circumstances: Secondary | ICD-10-CM

## 2022-07-04 NOTE — ED Triage Notes (Signed)
Patient here POV from Home.  Endorse being seen and discharged yesterday for neck pain. Given a Work Note providing for [employer] but he returns requesting a Work Note stating he cannot go to work or that he is cleared.  NAD noted during Triage. A&Ox4. GCS 15. Ambulatory.

## 2022-07-04 NOTE — ED Provider Notes (Signed)
[facility] Provider Note   CSN: [id number] Arrival date & time: 07/04/22  1351     History  Chief Complaint  Patient presents with   Work Note    Todd Barajas is a 27 y.o. male who presents emergency department with concerns for work note.  Was evaluated in the emergency department yesterday for his symptoms.  Denies recent fall, injury, trauma.  Notes that his symptoms initially started when he was trying to put his hair in a ponytail and he felt a sudden pain and tightness to his right-sided neck.  He notes that his symptoms have resolved since he was evaluated yesterday along with the treatments that were provided to him.  Denies chest pain, shortness of breath, numbness, tingling, weakness.  The history is provided by the patient. No language interpreter was used.       Home Medications Prior to Admission medications   Medication Sig Start Date End Date Taking? Authorizing Provider  amoxicillin-clavulanate (AUGMENTIN) 875-125 MG tablet Take 1 tablet by mouth every 12 (twelve) hours. 05/22/19   Corena Herter, PA-C  cyclobenzaprine (FLEXERIL) 10 MG tablet Take 1 tablet (10 mg total) by mouth 2 (two) times daily as needed for muscle spasms. 07/03/22   Gareth Morgan, MD  diphenhydrAMINE (BENADRYL) 25 MG tablet Take 1 tablet (25 mg total) by mouth every 6 (six) hours as needed. 08/16/19   Suzy Bouchard, PA-C  famotidine (PEPCID) 40 MG tablet Take 1 tablet (40 mg total) by mouth daily. 08/16/19   Suzy Bouchard, PA-C  fluticasone (FLONASE) 50 MCG/ACT nasal spray Place 2 sprays into both nostrils daily for 21 days. 05/22/19 06/12/19  Corena Herter, PA-C  hydrOXYzine (ATARAX/VISTARIL) 25 MG tablet Take 1 tablet (25 mg total) by mouth every 6 (six) hours as needed. [id number]   Delora Fuel, MD  lidocaine (LIDODERM) 5 % Place 1 patch onto the skin daily. Remove & Discard patch within 12 hours or as directed by MD 07/03/22   Gareth Morgan, MD  naproxen sodium (ALEVE) 220 MG tablet Take 220 mg by mouth as needed.    [provider]  pantoprazole (PROTONIX) 20 MG tablet TAKE 1 TABLET BY MOUTH EVERY DAY 06/17/19   Julian Hy, DO  permethrin (ELIMITE) 5 % cream Apply to affected area once 11/04/19   Khatri, Hina, PA-C  predniSONE (DELTASONE) 10 MG tablet Take 4 tablets (40 mg total) by mouth daily for 4 days. 07/03/22 07/07/22  Gareth Morgan, MD  triamcinolone cream (KENALOG) 0.1 % Apply 1 application topically 2 (two) times daily. 10/14/19   Couture, Cortni S, PA-C      Allergies    Patient has no known allergies.    Review of Systems   Review of Systems  All other systems reviewed and are negative.   Physical Exam Updated Vital Signs BP (!) 149/83 (BP Location: Right Arm)   Pulse 70   Temp 97.6 F (36.4 C) (Temporal)   Resp 18   Ht '5\' 11"'$  (1.803 m)   Wt 97.5 kg   SpO2 97%   BMI 29.98 kg/m  Physical Exam Vitals and nursing note reviewed.  Constitutional:      General: He is not in acute distress.    Appearance: Normal appearance.  Eyes:     General: No scleral icterus.    Extraocular Movements: Extraocular movements intact.  Cardiovascular:     Rate and Rhythm: Normal rate.  Pulmonary:  Effort: Pulmonary effort is normal. No respiratory distress.  Abdominal:     Palpations: Abdomen is soft. There is no mass.     Tenderness: There is no abdominal tenderness.  Musculoskeletal:        General: Normal range of motion.     Cervical back: Neck supple.     Comments: No spinal tenderness to palpation.  No tenderness to palpation noted to musculature of back.  Full active range of motion of neck without difficulty.  Skin:    General: Skin is warm and dry.     Findings: No rash.  Neurological:     Mental Status: He is alert.     Sensory: Sensation is intact.     Motor: Motor function is intact.  Psychiatric:        Behavior: Behavior normal.     ED Results / Procedures / Treatments    Labs (all labs ordered are listed, but only abnormal results are displayed) Labs Reviewed - No data to display  EKG None  Radiology No results found.  Procedures Procedures    Medications Ordered in ED Medications - No data to display  ED Course/ Medical Decision Making/ A&P                             Medical Decision Making  Pt presents with concerns for work note.  Patient was evaluated on the yesterday in the emergency department for neck pain and diagnosed with cervical strain.  Denies recent fall, injury, trauma.  Notes symptoms have resolved.  Here today for a work note saying he can go back to work.  Patient afebrile.  On exam, pt with No spinal tenderness to palpation.  No tenderness to palpation noted to musculature of back.  Full active range of motion of neck without difficulty. Differential diagnosis includes fracture, dislocation, herniation, strain.   Additional history obtained:  External records from outside source obtained and reviewed including: Patient was evaluated in the emergency department on yesterday and diagnosed with cervical strain.  Provided with prescription for Flexeril, lidocaine, prednisone.   Disposition: Patient presenting for work note due to cervical strain diagnosed on yesterday. Doubt concerns at this time for fracture, dislocation, herniation.  After consideration of the diagnostic results and the patients response to treatment, I feel that the patient would benefit from Discharge home. Work note provided and info for sports medicine group provided today. Supportive care measures and strict return precautions discussed with patient at bedside. Pt acknowledges and verbalizes understanding. Pt appears safe for discharge. Follow up as indicated in discharge paperwork.    This chart was dictated using voice recognition software, Dragon. Despite the best efforts of this provider to proofread and correct errors, errors may still occur which can  change documentation meaning.   Final Clinical Impression(s) / ED Diagnoses Final diagnoses:  Return to work evaluation    Rx / DC Orders ED Discharge Orders     None         Gema Ringold A, PA-C 07/04/22 1543    Malvin Johns, MD 07/04/22 1932

## 2022-07-04 NOTE — Discharge Instructions (Addendum)
It was a pleasure taking care of you today!   You may continue taking the prescription medicine as prescribed to you on yesterday. Do not drive or operate heavy machinery while taking this medication as it can make you sleepy/drowsy. Attached is information for the on-call sports medicine group, call and set up a follow up appointment regarding todays ED visit. Return to the ED if you are experiencing increasing/worsening symptoms.

## 2022-08-24 DIAGNOSIS — Z419 Encounter for procedure for purposes other than remedying health state, unspecified: Secondary | ICD-10-CM | POA: Diagnosis not present

## 2022-08-26 ENCOUNTER — Encounter (HOSPITAL_BASED_OUTPATIENT_CLINIC_OR_DEPARTMENT_OTHER): Payer: Self-pay

## 2022-08-26 ENCOUNTER — Emergency Department (HOSPITAL_BASED_OUTPATIENT_CLINIC_OR_DEPARTMENT_OTHER)
Admission: EM | Admit: 2022-08-26 | Discharge: 2022-08-26 | Disposition: A | Discharge status: Home / Self Care | Payer: Medicaid Other | Attending: Emergency Medicine | Admitting: Emergency Medicine

## 2022-08-26 ENCOUNTER — Other Ambulatory Visit: Payer: Self-pay

## 2022-08-26 ENCOUNTER — Emergency Department (HOSPITAL_BASED_OUTPATIENT_CLINIC_OR_DEPARTMENT_OTHER): Payer: Medicaid Other | Admitting: Radiology

## 2022-08-26 DIAGNOSIS — J45909 Unspecified asthma, uncomplicated: Secondary | ICD-10-CM | POA: Insufficient documentation

## 2022-08-26 DIAGNOSIS — R0789 Other chest pain: Secondary | ICD-10-CM | POA: Insufficient documentation

## 2022-08-26 DIAGNOSIS — E871 Hypo-osmolality and hyponatremia: Secondary | ICD-10-CM | POA: Diagnosis not present

## 2022-08-26 DIAGNOSIS — Z87891 Personal history of nicotine dependence: Secondary | ICD-10-CM | POA: Diagnosis not present

## 2022-08-26 DIAGNOSIS — Z7951 Long term (current) use of inhaled steroids: Secondary | ICD-10-CM | POA: Diagnosis not present

## 2022-08-26 LAB — CBC
HCT: 45.8 % (ref 39.0–52.0)
Hemoglobin: 14.7 g/dL (ref 13.0–17.0)
MCH: 26.8 pg (ref 26.0–34.0)
MCHC: 32.1 g/dL (ref 30.0–36.0)
MCV: 83.6 fL (ref 80.0–100.0)
Platelets: 315 10*3/uL (ref 150–400)
RBC: 5.48 MIL/uL (ref 4.22–5.81)
RDW: 13.6 % (ref 11.5–15.5)
WBC: 5.6 10*3/uL (ref 4.0–10.5)
nRBC: 0 % (ref 0.0–0.2)

## 2022-08-26 LAB — BASIC METABOLIC PANEL
Anion gap: 7 (ref 5–15)
BUN: 15 mg/dL (ref 6–20)
CO2: 23 mmol/L (ref 22–32)
Calcium: 9.3 mg/dL (ref 8.9–10.3)
Chloride: 103 mmol/L (ref 98–111)
Creatinine, Ser: 0.83 mg/dL (ref 0.61–1.24)
GFR, Estimated: >60 mL/min (ref >60)
Glucose, Bld: 77 mg/dL (ref 70–99)
Potassium: 4.4 mmol/L (ref 3.5–5.1)
Sodium: 133 mmol/L — ABNORMAL LOW (ref 135–145)

## 2022-08-26 LAB — TROPONIN I (HIGH SENSITIVITY): Troponin I (High Sensitivity): <2 ng/L (ref <18)

## 2022-08-26 MED ORDER — KETOROLAC TROMETHAMINE 15 MG/ML IJ SOLN
15.0000 mg | Freq: Once | INTRAMUSCULAR | Status: AC
  Administered 2022-08-26: 15 mg via INTRAMUSCULAR

## 2022-08-26 MED ORDER — IBUPROFEN 600 MG PO TABS: 600.0000 mg | ORAL_TABLET | Freq: Four times a day (QID) | ORAL | 0 refills | Status: DC | PRN

## 2022-08-26 MED ORDER — KETOROLAC TROMETHAMINE 15 MG/ML IJ SOLN
15.0000 mg | Freq: Once | INTRAMUSCULAR | Status: DC
  Filled 2022-08-26: qty 1

## 2022-08-26 NOTE — Discharge Instructions (Signed)
As discussed, workup today reassuring.  Recommend anti-inflammatory medication in the form of ibuprofen.  Recommend follow-up with primary care for reassessment of your symptoms.  Please not hesitate to return to emergency department for worrisome signs and symptoms we discussed become apparent.

## 2022-08-26 NOTE — ED Provider Notes (Signed)
[place] EMERGENCY DEPARTMENT AT [facility] Provider Note   CSN: [id number] Arrival date & time: 08/26/22  1222     History  Chief Complaint  Patient presents with   Chest Pain    Todd Barajas is a 27 y.o. male.   Chest Pain   27 year old male presents emergency department with complaints of chest pain.  Patient states that chest pain has been present for the past 2 to 3 days.  States that pain initially began when he was playing videogames at home smoking marijuana.  To the pain has been constant since onset.  Described as midsternal and sharp worsened with taking a deep breath.  Denies cough, congestion, shortness of breath, abdominal pain, nausea, vomiting, urinary symptoms, change in bowel habits.  Patient states he took Aleve at home which significantly improved symptoms.  Presents emergency department for further assessment.  Denies history of DVT/PE, recent surgery/immobilization, known malignancy/coagulopathy  Past medical history significant for asthma  Home Medications Prior to Admission medications   Medication Sig Start Date End Date Taking? Authorizing Provider  ibuprofen (ADVIL) 600 MG tablet Take 1 tablet (600 mg total) by mouth every 6 (six) hours as needed. 08/26/22  Yes Sherian Maroon A, PA  amoxicillin-clavulanate (AUGMENTIN) 875-125 MG tablet Take 1 tablet by mouth every 12 (twelve) hours. 05/22/19   Lorelee New, PA-C  cyclobenzaprine (FLEXERIL) 10 MG tablet Take 1 tablet (10 mg total) by mouth 2 (two) times daily as needed for muscle spasms. 07/03/22   Alvira Monday, MD  diphenhydrAMINE (BENADRYL) 25 MG tablet Take 1 tablet (25 mg total) by mouth every 6 (six) hours as needed. 08/16/19   Mannie Stabile, PA-C  famotidine (PEPCID) 40 MG tablet Take 1 tablet (40 mg total) by mouth daily. 08/16/19   Mannie Stabile, PA-C  fluticasone (FLONASE) 50 MCG/ACT nasal spray Place 2 sprays into both nostrils daily for 21 days. 05/22/19 06/12/19  Lorelee New, PA-C  hydrOXYzine (ATARAX/VISTARIL) 25 MG tablet Take 1 tablet (25 mg total) by mouth every 6 (six) hours as needed. 04/30/19   Dione Booze, MD  lidocaine (LIDODERM) 5 % Place 1 patch onto the skin daily. Remove & Discard patch within 12 hours or as directed by MD 07/03/22   Alvira Monday, MD  naproxen sodium (ALEVE) 220 MG tablet Take 220 mg by mouth as needed.    [provider]  pantoprazole (PROTONIX) 20 MG tablet TAKE 1 TABLET BY MOUTH EVERY DAY 06/17/19   Steffanie Dunn, DO  permethrin (ELIMITE) 5 % cream Apply to affected area once 11/04/19   Khatri, Hina, PA-C  triamcinolone cream (KENALOG) 0.1 % Apply 1 application topically 2 (two) times daily. 10/14/19   Couture, Cortni S, PA-C      Allergies    Patient has no known allergies.    Review of Systems   Review of Systems  Cardiovascular:  Positive for chest pain.  All other systems reviewed and are negative.   Physical Exam Updated Vital Signs BP 124/71 (BP Location: Right Arm)   Pulse (!) 57   Temp 98.4 F (36.9 C)   Resp 20   Ht 5\' 11"  (1.803 m)   Wt 97.5 kg   SpO2 97%   BMI 29.98 kg/m  Physical Exam Vitals and nursing note reviewed.  Constitutional:      General: He is not in acute distress.    Appearance: He is well-developed.  HENT:     Head: Normocephalic and atraumatic.  Eyes:     Conjunctiva/sclera: Conjunctivae normal.  Cardiovascular:     Rate and Rhythm: Normal rate and regular rhythm.     Heart sounds: No murmur heard. Pulmonary:     Effort: Pulmonary effort is normal. No respiratory distress.     Breath sounds: Normal breath sounds. No wheezing, rhonchi or rales.  Chest:     Chest wall: No tenderness.  Abdominal:     Palpations: Abdomen is soft.     Tenderness: There is no abdominal tenderness. There is no guarding.  Musculoskeletal:        General: No swelling.     Cervical back: Neck supple.     Right lower leg: No edema.     Left lower leg: No edema.  Skin:    General:  Skin is warm and dry.     Capillary Refill: Capillary refill takes less than 2 seconds.  Neurological:     Mental Status: He is alert.  Psychiatric:        Mood and Affect: Mood normal.     ED Results / Procedures / Treatments   Labs (all labs ordered are listed, but only abnormal results are displayed) Labs Reviewed  BASIC METABOLIC PANEL - Abnormal; Notable for the following components:      Result Value   Sodium 133 (*)    All other components within normal limits  CBC  TROPONIN I (HIGH SENSITIVITY)  TROPONIN I (HIGH SENSITIVITY)    EKG EKG Interpretation  Date/Time:  Friday Aug 26 2022 12:31:15 EDT Ventricular Rate:  62 PR Interval:  172 QRS Duration: 94 QT Interval:  390 QTC Calculation: 395 R Axis:   80 Text Interpretation: Normal sinus rhythm with sinus arrhythmia Nonspecific T wave abnormality Abnormal ECG When compared with ECG of 29-Apr-2019 22:11, T wave inversion less evident in Inferior leads agree Confirmed by Arby Barrette [phone]) on 08/26/2022 12:56:52 PM  Radiology DG Chest 2 View  Result Date: 08/26/2022 CLINICAL DATA:  Chest pain. EXAM: CHEST - 2 VIEW COMPARISON:  04/29/2019. FINDINGS: Clear lungs. Normal heart size and mediastinal contours. No pleural effusion or pneumothorax. Visualized bones and upper abdomen are unremarkable. IMPRESSION: No evidence of acute cardiopulmonary disease. Electronically Signed   By: Orvan Falconer M.D.   On: 08/26/2022 12:43    Procedures Procedures    Medications Ordered in ED Medications  ketorolac (TORADOL) 15 MG/ML injection 15 mg (15 mg Intramuscular Given 08/26/22 1359)    ED Course/ Medical Decision Making/ A&P                             Medical Decision Making Amount and/or Complexity of Data Reviewed Labs: ordered. Radiology: ordered.  Risk Prescription drug management.   This patient presents to the ED for concern of chest pain, this involves an extensive number of treatment options, and is a  complaint that carries with it a high risk of complications and morbidity.  The differential diagnosis includes ACS, PE, pneumothorax, pleurisy, aortic dissection, aortic aneurysm, pneumonia, musculoskeletal   Co morbidities that complicate the patient evaluation  See HPI   Additional history obtained:  Additional history obtained from EMR External records from outside source obtained and reviewed including hospital records   Lab Tests:  I Ordered, and personally interpreted labs.  The pertinent results include: No leukocytosis noted.  No evidence of anemia.  Platelets within normal range.  Mild hyponatremia of 133 but otherwise, electrolytes within normal limits.  No renal dysfunction.  Troponin of less than 2; given duration of symptoms, second troponin deemed unnecessary   Imaging Studies ordered:  I ordered imaging studies including chest x-ray I independently visualized and interpreted imaging which showed no acute cardiopulmonary disease I agree with the radiologist interpretation   Cardiac Monitoring: / EKG:  The patient was maintained on a cardiac monitor.  I personally viewed and interpreted the cardiac monitored which showed an underlying rhythm of: Normal sinus rhythm with sinus arrhythmia with T wave inversion in inferior leads of which is less prominent than prior EKG performed   Consultations Obtained:  na   Problem List / ED Course / Critical interventions / Medication management  Chest pain I ordered medication including Toradol   Reevaluation of the patient after these medicines showed that the patient improved I have reviewed the patients home medicines and have made adjustments as needed   Social Determinants of Health:  Former cigarette use.  Denies illicit drug use.  Test / Admission - Considered:  Chest pain Vitals signs within normal range and stable throughout visit. Laboratory/imaging studies significant for: See above 27 year old male  presents emergency department with complaints of chest pain.  Chest pain seems to be worsened with taking a deep breath described as sharp and constant in nature.  Patient with negative troponin; given duration of symptoms, second opponent deemed necessary at this time.  Low suspicion for ACS, pericarditis/myocarditis//tamponade.  Patient's chest pain described as pleuritic but patient with Wells PE score of 0 and PERC negative so low suspicion for PE.  Patient noted significant improvement in symptoms with administration of antiinflammatory at home.  Plan to treat patient with continued nonsteroidal medication with close follow-up with primary care for reassessment.  Treatment plan discussed at length with patient and he acknowledged understanding was agreeable to said plan. Worrisome signs and symptoms were discussed with the patient, and the patient acknowledged understanding to return to the ED if noticed. Patient was stable upon discharge.          Final Clinical Impression(s) / ED Diagnoses Final diagnoses:  Other chest pain    Rx / DC Orders ED Discharge Orders          Ordered    ibuprofen (ADVIL) 600 MG tablet  Every 6 hours PRN        08/26/22 1419              Peter Garter, [place] 08/26/22 1419    Arby Barrette, MD 09/05/22 1941

## 2022-08-26 NOTE — ED Triage Notes (Addendum)
Patient arrives with complaints of consistent sharp chest pain x3 days. Reports no falls, injuries, or nausea/vomiting.

## 2022-09-05 ENCOUNTER — Emergency Department (HOSPITAL_BASED_OUTPATIENT_CLINIC_OR_DEPARTMENT_OTHER)
Admission: EM | Admit: 2022-09-05 | Discharge: 2022-09-05 | Discharge status: Against Medical Advice | Payer: Medicaid Other | Attending: Emergency Medicine | Admitting: Emergency Medicine

## 2022-09-05 ENCOUNTER — Other Ambulatory Visit: Payer: Self-pay

## 2022-09-05 ENCOUNTER — Encounter (HOSPITAL_BASED_OUTPATIENT_CLINIC_OR_DEPARTMENT_OTHER): Payer: Self-pay

## 2022-09-05 DIAGNOSIS — R079 Chest pain, unspecified: Secondary | ICD-10-CM | POA: Insufficient documentation

## 2022-09-05 DIAGNOSIS — F1210 Cannabis abuse, uncomplicated: Secondary | ICD-10-CM | POA: Insufficient documentation

## 2022-09-05 DIAGNOSIS — R1013 Epigastric pain: Secondary | ICD-10-CM | POA: Diagnosis not present

## 2022-09-05 DIAGNOSIS — J45909 Unspecified asthma, uncomplicated: Secondary | ICD-10-CM | POA: Diagnosis not present

## 2022-09-05 DIAGNOSIS — R109 Unspecified abdominal pain: Secondary | ICD-10-CM | POA: Insufficient documentation

## 2022-09-05 NOTE — ED Provider Notes (Signed)
[place] EMERGENCY DEPARTMENT AT [facility] Provider Note   CSN: [id number] Arrival date & time: 09/05/22  4098     History  Chief Complaint  Patient presents with   Abdominal Pain    epigastric    Todd Barajas is a 27 y.o. male.  Patient is a 27 year old male with past medical history of asthma.  Patient presenting today with complaints of abdominal pain and chest pain.  He reports eating excessive quantities of junk food earlier today while playing videogames and smoking excessive quantities of marijuana.  He developed an urge to defecate, then went to the bathroom and had loose stools.  He then had an episode where he became tight in his chest and felt short of breath to the point had difficulty speaking.  This lasted approximately 30 to 45 minutes but seems to have improved.  He presents now with pain in his epigastric region and chest.  No fevers or chills.  No cough.  The history is provided by the patient.       Home Medications Prior to Admission medications   Medication Sig Start Date End Date Taking? Authorizing Provider  amoxicillin-clavulanate (AUGMENTIN) 875-125 MG tablet Take 1 tablet by mouth every 12 (twelve) hours. 05/22/19   Lorelee New, PA-C  cyclobenzaprine (FLEXERIL) 10 MG tablet Take 1 tablet (10 mg total) by mouth 2 (two) times daily as needed for muscle spasms. 07/03/22   Alvira Monday, MD  diphenhydrAMINE (BENADRYL) 25 MG tablet Take 1 tablet (25 mg total) by mouth every 6 (six) hours as needed. 08/16/19   Mannie Stabile, PA-C  famotidine (PEPCID) 40 MG tablet Take 1 tablet (40 mg total) by mouth daily. 08/16/19   Mannie Stabile, PA-C  fluticasone (FLONASE) 50 MCG/ACT nasal spray Place 2 sprays into both nostrils daily for 21 days. 05/22/19 06/12/19  Lorelee New, PA-C  hydrOXYzine (ATARAX/VISTARIL) 25 MG tablet Take 1 tablet (25 mg total) by mouth every 6 (six) hours as needed. 04/30/19   Dione Booze, MD  ibuprofen (ADVIL) 600 MG  tablet Take 1 tablet (600 mg total) by mouth every 6 (six) hours as needed. 08/26/22   Sherian Maroon A, PA  lidocaine (LIDODERM) 5 % Place 1 patch onto the skin daily. Remove & Discard patch within 12 hours or as directed by MD 07/03/22   Alvira Monday, MD  naproxen sodium (ALEVE) 220 MG tablet Take 220 mg by mouth as needed.    [provider]  pantoprazole (PROTONIX) 20 MG tablet TAKE 1 TABLET BY MOUTH EVERY DAY 06/17/19   Steffanie Dunn, DO  permethrin (ELIMITE) 5 % cream Apply to affected area once 11/04/19   Khatri, Hina, PA-C  triamcinolone cream (KENALOG) 0.1 % Apply 1 application topically 2 (two) times daily. 10/14/19   Couture, Cortni S, PA-C      Allergies    Patient has no known allergies.    Review of Systems   Review of Systems  All other systems reviewed and are negative.   Physical Exam Updated Vital Signs BP 120/70   Pulse 70   Temp 97.9 F (36.6 C)   Resp 18   Ht 5\' 11"  (1.803 m)   Wt 97.5 kg   SpO2 100%   BMI 29.98 kg/m  Physical Exam Vitals and nursing note reviewed.  Constitutional:      General: He is not in acute distress.    Appearance: He is well-developed. He is not diaphoretic.  HENT:  Head: Normocephalic and atraumatic.  Cardiovascular:     Rate and Rhythm: Normal rate and regular rhythm.     Heart sounds: No murmur heard.    No friction rub.  Pulmonary:     Effort: Pulmonary effort is normal. No respiratory distress.     Breath sounds: Normal breath sounds. No wheezing or rales.  Abdominal:     General: Bowel sounds are normal. There is no distension.     Palpations: Abdomen is soft.     Tenderness: There is no abdominal tenderness.  Musculoskeletal:        General: Normal range of motion.     Cervical back: Normal range of motion and neck supple.  Skin:    General: Skin is warm and dry.  Neurological:     Mental Status: He is alert and oriented to person, place, and time.     Coordination: Coordination normal.     ED  Results / Procedures / Treatments   Labs (all labs ordered are listed, but only abnormal results are displayed) Labs Reviewed  COMPREHENSIVE METABOLIC PANEL  CBC WITH DIFFERENTIAL/PLATELET  LIPASE, BLOOD  TROPONIN I (HIGH SENSITIVITY)    EKG ED ECG REPORT   Date: 09/05/2022  Rate: 71  Rhythm: normal sinus rhythm  QRS Axis: normal  Intervals: normal  ST/T Wave abnormalities: normal  Conduction Disutrbances:none  Narrative Interpretation:   Old EKG Reviewed: unchanged  I have personally reviewed the EKG tracing and agree with the computerized printout as noted.   Radiology No results found.  Procedures Procedures    Medications Ordered in ED Medications - No data to display  ED Course/ Medical Decision Making/ A&P  Patient presenting here with complaints of chest and abdominal pain as described in the HPI.  He arrives here with stable vital signs and physical examination which is unremarkable.  He states he feels fine now.  EKG obtained in triage shows no acute changes from earlier this month.  After evaluating the patient, I informed him we would check blood work to make sure his heart and abdominal enzymes were okay.  Shortly after I left the room, I was informed by the nurse that the patient had left the emergency department.  Final Clinical Impression(s) / ED Diagnoses Final diagnoses:  None    Rx / DC Orders ED Discharge Orders     None         Geoffery Lyons, MD 09/05/22 [phone]

## 2022-09-05 NOTE — ED Triage Notes (Signed)
Patient c/o epigastric pain since 0145. Patient denies n/v, some diarrhea. Patient states he had some weird food today. Patient states that this has happened before (several years ago). Patient states the incident made his chest tight which makes it hard to breathe. Patient states he smokes weed everyday and doesn't know if that is a contributing factor.

## 2022-09-05 NOTE — ED Notes (Signed)
Patient states he feels better and that he just had all these same things done and just wants to leave. Patient signs AMA form

## 2022-09-24 DIAGNOSIS — Z419 Encounter for procedure for purposes other than remedying health state, unspecified: Secondary | ICD-10-CM | POA: Diagnosis not present

## 2022-10-24 DIAGNOSIS — Z419 Encounter for procedure for purposes other than remedying health state, unspecified: Secondary | ICD-10-CM | POA: Diagnosis not present

## 2022-11-24 DIAGNOSIS — Z419 Encounter for procedure for purposes other than remedying health state, unspecified: Secondary | ICD-10-CM | POA: Diagnosis not present

## 2022-11-29 ENCOUNTER — Other Ambulatory Visit: Payer: Self-pay

## 2022-11-29 ENCOUNTER — Encounter (HOSPITAL_BASED_OUTPATIENT_CLINIC_OR_DEPARTMENT_OTHER): Payer: Self-pay

## 2022-11-29 ENCOUNTER — Emergency Department (HOSPITAL_BASED_OUTPATIENT_CLINIC_OR_DEPARTMENT_OTHER)
Admission: EM | Admit: 2022-11-29 | Discharge: 2022-11-29 | Disposition: A | Discharge status: Home / Self Care | Payer: Medicaid Other | Attending: Emergency Medicine | Admitting: Emergency Medicine

## 2022-11-29 ENCOUNTER — Emergency Department (HOSPITAL_BASED_OUTPATIENT_CLINIC_OR_DEPARTMENT_OTHER): Payer: Medicaid Other

## 2022-11-29 DIAGNOSIS — R072 Precordial pain: Secondary | ICD-10-CM | POA: Insufficient documentation

## 2022-11-29 DIAGNOSIS — R0602 Shortness of breath: Secondary | ICD-10-CM | POA: Diagnosis not present

## 2022-11-29 DIAGNOSIS — J45909 Unspecified asthma, uncomplicated: Secondary | ICD-10-CM | POA: Insufficient documentation

## 2022-11-29 DIAGNOSIS — R0989 Other specified symptoms and signs involving the circulatory and respiratory systems: Secondary | ICD-10-CM | POA: Diagnosis not present

## 2022-11-29 DIAGNOSIS — R0789 Other chest pain: Secondary | ICD-10-CM

## 2022-11-29 DIAGNOSIS — R079 Chest pain, unspecified: Secondary | ICD-10-CM | POA: Diagnosis not present

## 2022-11-29 LAB — CBC
HCT: 46.7 % (ref 39.0–52.0)
Hemoglobin: 15.1 g/dL (ref 13.0–17.0)
MCH: 27 pg (ref 26.0–34.0)
MCHC: 32.3 g/dL (ref 30.0–36.0)
MCV: 83.4 fL (ref 80.0–100.0)
Platelets: 299 K/uL (ref 150–400)
RBC: 5.6 MIL/uL (ref 4.22–5.81)
RDW: 12.7 % (ref 11.5–15.5)
WBC: 8.5 K/uL (ref 4.0–10.5)
nRBC: 0 % (ref 0.0–0.2)

## 2022-11-29 LAB — TROPONIN I (HIGH SENSITIVITY): Troponin I (High Sensitivity): <2 ng/L (ref <18)

## 2022-11-29 LAB — BASIC METABOLIC PANEL
Anion gap: 8 (ref 5–15)
BUN: 11 mg/dL (ref 6–20)
CO2: 28 mmol/L (ref 22–32)
Calcium: 9.6 mg/dL (ref 8.9–10.3)
Chloride: 102 mmol/L (ref 98–111)
Creatinine, Ser: 1.02 mg/dL (ref 0.61–1.24)
GFR, Estimated: >60 mL/min (ref >60)
Glucose, Bld: 84 mg/dL (ref 70–99)
Potassium: 4.3 mmol/L (ref 3.5–5.1)
Sodium: 138 mmol/L (ref 135–145)

## 2022-11-29 NOTE — Discharge Instructions (Addendum)
Evaluation for your chest pain was overall reassuring.  Suspect it may be correlated to some of your marijuana smoking.  Recommend that you consider discontinuing your smoking habits.  If your chest pain gets worse, you have shortness of breath, calf tenderness, develop a cough and fever or any other concerning symptom please return emergency department further evaluation.

## 2022-11-29 NOTE — ED Provider Notes (Signed)
[place] EMERGENCY DEPARTMENT AT [facility] Provider Note   CSN: [id number] Arrival date & time: 11/29/22  1842     History  No chief complaint on file.  HPI Todd Barajas is a 27 y.o. male with asthma and marijuana user presenting for chest pain.  Started on Friday.  Located midsternally.  Felt like pressure.  Is nonradiating.  Endorses associated shortness of breath.  States that it did hurt worse with breathing.  States it has been constant throughout the weekend which ultimately prompted his evaluation.  Does state that at this time it has improved significantly.  Denies calf tenderness and recent mobilization and no malignancy.  Denies cough and fever.  HPI     Home Medications Prior to Admission medications   Medication Sig Start Date End Date Taking? Authorizing Provider  amoxicillin-clavulanate (AUGMENTIN) 875-125 MG tablet Take 1 tablet by mouth every 12 (twelve) hours. 05/22/19   Lorelee New, PA-C  cyclobenzaprine (FLEXERIL) 10 MG tablet Take 1 tablet (10 mg total) by mouth 2 (two) times daily as needed for muscle spasms. 07/03/22   Alvira Monday, MD  diphenhydrAMINE (BENADRYL) 25 MG tablet Take 1 tablet (25 mg total) by mouth every 6 (six) hours as needed. 08/16/19   Mannie Stabile, PA-C  famotidine (PEPCID) 40 MG tablet Take 1 tablet (40 mg total) by mouth daily. 08/16/19   Mannie Stabile, PA-C  fluticasone (FLONASE) 50 MCG/ACT nasal spray Place 2 sprays into both nostrils daily for 21 days. 05/22/19 06/12/19  Lorelee New, PA-C  hydrOXYzine (ATARAX/VISTARIL) 25 MG tablet Take 1 tablet (25 mg total) by mouth every 6 (six) hours as needed. 04/30/19   Dione Booze, MD  ibuprofen (ADVIL) 600 MG tablet Take 1 tablet (600 mg total) by mouth every 6 (six) hours as needed. 08/26/22   Sherian Maroon A, PA  lidocaine (LIDODERM) 5 % Place 1 patch onto the skin daily. Remove & Discard patch within 12 hours or as directed by MD 07/03/22   Alvira Monday, MD   naproxen sodium (ALEVE) 220 MG tablet Take 220 mg by mouth as needed.    [provider]  pantoprazole (PROTONIX) 20 MG tablet TAKE 1 TABLET BY MOUTH EVERY DAY 06/17/19   Steffanie Dunn, DO  permethrin (ELIMITE) 5 % cream Apply to affected area once 11/04/19   Khatri, Hina, PA-C  triamcinolone cream (KENALOG) 0.1 % Apply 1 application topically 2 (two) times daily. 10/14/19   Couture, Cortni S, PA-C      Allergies    Patient has no known allergies.    Review of Systems   See HPI for pertinent positives.  Physical Exam Updated Vital Signs BP (!) 136/93   Pulse 64   Temp 98.8 F (37.1 C)   Resp 10   SpO2 100%  Physical Exam Vitals and nursing note reviewed.  HENT:     Head: Normocephalic and atraumatic.     Mouth/Throat:     Mouth: Mucous membranes are moist.  Eyes:     General:        Right eye: No discharge.        Left eye: No discharge.     Conjunctiva/sclera: Conjunctivae normal.  Cardiovascular:     Rate and Rhythm: Normal rate and regular rhythm.     Pulses: Normal pulses.     Heart sounds: Normal heart sounds.  Pulmonary:     Effort: Pulmonary effort is normal.     Breath sounds: Normal breath sounds.  Abdominal:     General: Abdomen is flat.     Palpations: Abdomen is soft.  Skin:    General: Skin is warm and dry.  Neurological:     General: No focal deficit present.  Psychiatric:        Mood and Affect: Mood normal.     ED Results / Procedures / Treatments   Labs (all labs ordered are listed, but only abnormal results are displayed) Labs Reviewed  BASIC METABOLIC PANEL  CBC  TROPONIN I (HIGH SENSITIVITY)    EKG None  Radiology DG Chest Port 1 View  Result Date: 11/29/2022 CLINICAL DATA:  Chest pain. EXAM: PORTABLE CHEST 1 VIEW COMPARISON:  Aug 26, 2022 FINDINGS: The heart size and mediastinal contours are within normal limits. Low lung volumes are noted. Both lungs are clear. The visualized skeletal structures are unremarkable.  IMPRESSION: No active disease. Electronically Signed   By: Aram Candela M.D.   On: 11/29/2022 20:19    Procedures Procedures    Medications Ordered in ED Medications - No data to display  ED Course/ Medical Decision Making/ A&P                                 Medical Decision Making Amount and/or Complexity of Data Reviewed Labs: ordered. Radiology: ordered.   Discussed smoking cessation with patient and was they were offerred resources to help stop.  Total time was 5 min CPT code 70623.   27 year old well-appearing male presenting for chest pain.  Exam was unremarkable.  DDx includes PE, ACS, pneumonia, pneumothorax, rib fracture, CHF exacerbation.  On serial reassessments patient continued to endorse improved chest pain.  On last assessment stated that it had completely resolved.  Workup does not suggest ACS.  Considered PE as well but unlikely given he is low risk per Wells criteria and he is PERC negative.  Advised him to follow-up with his PCP.  Had a lengthy discussion about smoking cessation.  As some of his symptoms may be correlated to daily marijuana use.  Discussed pertinent return precautions.  Advised to follow-up with his PCP.  Vital stable.  Discharged home in good condition.        Final Clinical Impression(s) / ED Diagnoses Final diagnoses:  Atypical chest pain    Rx / DC Orders ED Discharge Orders     None         Gareth Eagle, PA-C 11/29/22 2133    Glyn Ade, MD 11/30/22 1610

## 2022-11-29 NOTE — ED Triage Notes (Signed)
Pt states that Friday, he smoked marijuana & chest became tight in the center, states that every time he inhaled, it hurt to breathe. States that when he stopped smoking, pain stopped. States he smoked again today, pain returned. Pt in NAD, denies NVD, SHOB, radiation, additional symptoms.

## 2022-11-29 NOTE — ED Notes (Signed)
Pt verbalized understanding of d/c instructions, meds, and followup care. Denies questions. VSS, no distress noted. Steady gait to exit with all belongings.  ?

## 2022-12-09 ENCOUNTER — Emergency Department (HOSPITAL_BASED_OUTPATIENT_CLINIC_OR_DEPARTMENT_OTHER)
Admission: EM | Admit: 2022-12-09 | Discharge: 2022-12-10 | Disposition: A | Discharge status: Home / Self Care | Payer: Medicaid Other | Attending: Emergency Medicine | Admitting: Emergency Medicine

## 2022-12-09 ENCOUNTER — Emergency Department (HOSPITAL_BASED_OUTPATIENT_CLINIC_OR_DEPARTMENT_OTHER): Payer: Medicaid Other | Admitting: Radiology

## 2022-12-09 ENCOUNTER — Other Ambulatory Visit: Payer: Self-pay

## 2022-12-09 ENCOUNTER — Encounter (HOSPITAL_BASED_OUTPATIENT_CLINIC_OR_DEPARTMENT_OTHER): Payer: Self-pay | Admitting: Emergency Medicine

## 2022-12-09 DIAGNOSIS — R142 Eructation: Secondary | ICD-10-CM | POA: Insufficient documentation

## 2022-12-09 DIAGNOSIS — R070 Pain in throat: Secondary | ICD-10-CM | POA: Diagnosis not present

## 2022-12-09 NOTE — ED Triage Notes (Addendum)
C/c burping and throat pain.   Smoked marijuana 2 weeks ago, felt a sharp pain which resolved after stopping smoking. Was seen that day (8/6) in ER for this episode. Chest pain has been unchanged since that evaluation.    Now concerned about frequent burping and throat pain.  Endorses throat tightness with swallowing. Denies fever, chills, itching  H/o reflux  Has tried mylanta without relief.  No marijuana use since 8/6

## 2022-12-10 MED ORDER — OMEPRAZOLE 20 MG PO CPDR: 20.0000 mg | DELAYED_RELEASE_CAPSULE | Freq: Every day | ORAL | 0 refills | Status: DC

## 2022-12-10 NOTE — Discharge Instructions (Addendum)
You were seen in the ER for evaluation of you belching. This is likely your acid reflux. I am going to refer you to a GI provider for you to see. Please make sure that you call to schedule an appointment. I am prescribing you omeprazole for you to take daily for the next two weeks. Please take as prescribed. I have also listed information for a eating plan with acid reflux. Please review. If you have any concerns, new or worsening symptoms, please return to your nearest ER for re-evaluation.   Contact a health care provider if: You have nausea and vomiting. You have diarrhea. You have abdominal pain. You have unusual weight loss or weight gain. You have severe pain, and medicines do not help. Get help right away if: You have chest pain. You have trouble breathing. You have shortness of breath. You have trouble urinating. You have darker urine than normal. You have blood in your stools or have dark, tarry stools. These symptoms may represent a serious problem that is an emergency. Do not wait to see if the symptoms will go away. Get medical help right away. Call your local emergency services (911 in the [place] Do not drive yourself to the hospital.

## 2022-12-10 NOTE — ED Notes (Signed)
Reviewed AVS/discharge instruction with patient. Time allotted for and all questions answered. Patient is agreeable for d/c and escorted to ed exit by staff.  

## 2022-12-10 NOTE — ED Provider Notes (Signed)
[place] EMERGENCY DEPARTMENT AT [facility] Provider Note   CSN: [id number] Arrival date & time: 12/09/22  2019     History {Add pertinent medical, surgical, social history, OB history to HPI:1} Chief Complaint  Patient presents with   Belching    Todd Barajas is a 27 y.o. male.  HPI     Home Medications Prior to Admission medications   Medication Sig Start Date End Date Taking? Authorizing Provider  amoxicillin-clavulanate (AUGMENTIN) 875-125 MG tablet Take 1 tablet by mouth every 12 (twelve) hours. 05/22/19   Lorelee New, PA-C  cyclobenzaprine (FLEXERIL) 10 MG tablet Take 1 tablet (10 mg total) by mouth 2 (two) times daily as needed for muscle spasms. 07/03/22   Alvira Monday, MD  diphenhydrAMINE (BENADRYL) 25 MG tablet Take 1 tablet (25 mg total) by mouth every 6 (six) hours as needed. 08/16/19   Mannie Stabile, PA-C  famotidine (PEPCID) 40 MG tablet Take 1 tablet (40 mg total) by mouth daily. 08/16/19   Mannie Stabile, PA-C  fluticasone (FLONASE) 50 MCG/ACT nasal spray Place 2 sprays into both nostrils daily for 21 days. 05/22/19 06/12/19  Lorelee New, PA-C  hydrOXYzine (ATARAX/VISTARIL) 25 MG tablet Take 1 tablet (25 mg total) by mouth every 6 (six) hours as needed. 04/30/19   Dione Booze, MD  ibuprofen (ADVIL) 600 MG tablet Take 1 tablet (600 mg total) by mouth every 6 (six) hours as needed. 08/26/22   Sherian Maroon A, PA  lidocaine (LIDODERM) 5 % Place 1 patch onto the skin daily. Remove & Discard patch within 12 hours or as directed by MD 07/03/22   Alvira Monday, MD  naproxen sodium (ALEVE) 220 MG tablet Take 220 mg by mouth as needed.    [provider]  pantoprazole (PROTONIX) 20 MG tablet TAKE 1 TABLET BY MOUTH EVERY DAY 06/17/19   Steffanie Dunn, DO  permethrin (ELIMITE) 5 % cream Apply to affected area once 11/04/19   Khatri, Hina, PA-C  triamcinolone cream (KENALOG) 0.1 % Apply 1 application topically 2 (two) times daily. 10/14/19    Couture, Cortni S, PA-C      Allergies    Patient has no known allergies.    Review of Systems   Review of Systems  Physical Exam Updated Vital Signs BP 121/62   Pulse 68   Temp 98.2 F (36.8 C) (Oral)   Resp 18   Wt 97 kg   SpO2 97%   BMI 29.83 kg/m  Physical Exam  ED Results / Procedures / Treatments   Labs (all labs ordered are listed, but only abnormal results are displayed) Labs Reviewed - No data to display  EKG None  Radiology DG Chest 2 View  Result Date: 12/09/2022 CLINICAL DATA:  Burping and throat pain. EXAM: CHEST - 2 VIEW COMPARISON:  November 29, 2022 FINDINGS: The heart size and mediastinal contours are within normal limits. Both lungs are clear. The visualized skeletal structures are unremarkable. IMPRESSION: No active cardiopulmonary disease. Electronically Signed   By: Aram Candela M.D.   On: 12/09/2022 22:27    Procedures Procedures  {Document cardiac monitor, telemetry assessment procedure when appropriate:1}  Medications Ordered in ED Medications - No data to display  ED Course/ Medical Decision Making/ A&P   {   Click here for ABCD2, HEART and other calculatorsREFRESH Note before signing :1}  Medical Decision Making Amount and/or Complexity of Data Reviewed Radiology: ordered.   ***  {Document critical care time when appropriate:1} {Document review of labs and clinical decision tools ie heart score, Chads2Vasc2 etc:1}  {Document your independent review of radiology images, and any outside records:1} {Document your discussion with family members, caretakers, and with consultants:1} {Document social determinants of health affecting pt's care:1} {Document your decision making why or why not admission, treatments were needed:1} Final Clinical Impression(s) / ED Diagnoses Final diagnoses:  None    Rx / DC Orders ED Discharge Orders     None

## 2022-12-13 ENCOUNTER — Encounter: Payer: Self-pay | Admitting: Gastroenterology

## 2022-12-13 ENCOUNTER — Ambulatory Visit
Admission: RE | Admit: 2022-12-13 | Discharge: 2022-12-13 | Disposition: A | Discharge status: Home / Self Care | Payer: Medicaid Other | Source: Ambulatory Visit | Attending: Internal Medicine | Admitting: Internal Medicine

## 2022-12-13 ENCOUNTER — Other Ambulatory Visit: Payer: Self-pay

## 2022-12-13 VITALS — BP 129/76 | HR 53 | Temp 98.5°F | Resp 18

## 2022-12-13 DIAGNOSIS — R142 Eructation: Secondary | ICD-10-CM

## 2022-12-13 NOTE — Discharge Instructions (Addendum)
Continue the omeprazole once daily for the next 2 weeks.  If you feel like your symptoms or not improved at that point you may increase it to twice daily.  Continue avoiding carbonation, fried/processed foods.  You should follow-up with your PCP at your scheduled appointment in September.  I have referred you to gastroenterology as well for further workup of the symptoms.  I do hope you feel better soon.

## 2022-12-13 NOTE — ED Provider Notes (Signed)
[facility]    CSN: [id number] Arrival date & time: 12/13/22  0920      History   Chief Complaint Chief Complaint  Patient presents with   Heartburn    Got told it was acid reflux at hospital but it's been like this for weeks and the omeprazole isn't working. I do not feel 100% - Entered by patient    HPI Todd Barajas is a 27 y.o. male presents for evaluation of belching.  Patient seen in the ER for this twice in August and once in May.  In May he was evaluated for epigastric pain but he left AMA.  He was again seen in the ER in August 6 for atypical chest pain.  EKG labs and troponin were normal.  Chest x-ray was normal as well.  Chart review shows he has a cardiology appointment in October.  He was seen again in the ER in August 16 for belching and was started on omeprazole 20 mg daily.  He states he has been taking it for 4 days with minimal improvement.  He does report over the past month he has changed his diet dramatically and only eats healthy foods such as proteins and vegetables.  States he only drinks water and denies any carbonation or caffeinated beverages.  No energy drinks or dietary supplements.  He also reports he has been a heavy marijuana smoker for 10 years and recently stopped this as well.  He reports he is seeing either GI or ENT in the past and was told he had a benign nodule in his throat and it was nothing to worry about but he did not have any more specifics regarding this.  Denies any difficulty swallowing, breathing.  No current chest pain, shortness of breath, abdominal pain, vomiting, diarrhea.  Does endorse some soft stools.  No new medications aside from the omeprazole.  No other concerns at this time.   Heartburn    Past Medical History:  Diagnosis Date   Asthma     There are no problems to display for this patient.   History reviewed. No pertinent surgical history.     Home Medications    Prior to Admission medications   Medication  Sig Start Date End Date Taking? Authorizing Provider  amoxicillin-clavulanate (AUGMENTIN) 875-125 MG tablet Take 1 tablet by mouth every 12 (twelve) hours. 05/22/19   Lorelee New, PA-C  cyclobenzaprine (FLEXERIL) 10 MG tablet Take 1 tablet (10 mg total) by mouth 2 (two) times daily as needed for muscle spasms. 07/03/22   Alvira Monday, MD  diphenhydrAMINE (BENADRYL) 25 MG tablet Take 1 tablet (25 mg total) by mouth every 6 (six) hours as needed. 08/16/19   Mannie Stabile, PA-C  fluticasone (FLONASE) 50 MCG/ACT nasal spray Place 2 sprays into both nostrils daily for 21 days. 05/22/19 06/12/19  Lorelee New, PA-C  hydrOXYzine (ATARAX/VISTARIL) 25 MG tablet Take 1 tablet (25 mg total) by mouth every 6 (six) hours as needed. 04/30/19   Dione Booze, MD  ibuprofen (ADVIL) 600 MG tablet Take 1 tablet (600 mg total) by mouth every 6 (six) hours as needed. 08/26/22   Sherian Maroon A, PA  lidocaine (LIDODERM) 5 % Place 1 patch onto the skin daily. Remove & Discard patch within 12 hours or as directed by MD 07/03/22   Alvira Monday, MD  naproxen sodium (ALEVE) 220 MG tablet Take 220 mg by mouth as needed.    [provider]  omeprazole (PRILOSEC) 20 MG  capsule Take 1 capsule (20 mg total) by mouth daily. 12/10/22   Achille Rich, PA-C  permethrin (ELIMITE) 5 % cream Apply to affected area once 11/04/19   Khatri, Hina, PA-C  triamcinolone cream (KENALOG) 0.1 % Apply 1 application topically 2 (two) times daily. 10/14/19   Couture, Cortni S, PA-C    Family History Family History  Problem Relation Age of Onset   Colon cancer Mother    Asthma Father    Hypertension Father    Stroke Father    Heart disease Father     Social History Social History   Tobacco Use   Smoking status: Some Days    Current packs/day: 0.00    Average packs/day: 0.2 packs/day for 6.7 years (1.3 ttl pk-yrs)    Types: Cigars, Cigarettes    Start date: 2014    Last attempt to quit: 01/04/2019    Years since  quitting: 3.9   Smokeless tobacco: Never  Vaping Use   Vaping status: Never Used  Substance Use Topics   Alcohol use: No   Drug use: Yes    Types: Marijuana     Allergies   Patient has no known allergies.   Review of Systems Review of Systems  Gastrointestinal:  Positive for heartburn.       Frequent belching     Physical Exam Triage Vital Signs ED Triage Vitals  Encounter Vitals Group     BP 12/13/22 0931 129/76     Systolic BP Percentile --      Diastolic BP Percentile --      Pulse Rate 12/13/22 0931 (!) 53     Resp 12/13/22 0931 18     Temp 12/13/22 0931 98.5 F (36.9 C)     Temp Source 12/13/22 0931 Oral     SpO2 12/13/22 0931 97 %     Weight --      Height --      Head Circumference --      Peak Flow --      Pain Score 12/13/22 0930 0     Pain Loc --      Pain Education --      Exclude from Growth Chart --    No data found.  Updated Vital Signs BP 129/76 (BP Location: Right Arm)   Pulse (!) 53   Temp 98.5 F (36.9 C) (Oral)   Resp 18   SpO2 97%   Visual Acuity Right Eye Distance:   Left Eye Distance:   Bilateral Distance:    Right Eye Near:   Left Eye Near:    Bilateral Near:     Physical Exam Vitals and nursing note reviewed.  Constitutional:      General: He is not in acute distress.    Appearance: Normal appearance. He is not ill-appearing.  HENT:     Head: Normocephalic and atraumatic.     Mouth/Throat:     Lips: Pink.     Mouth: Mucous membranes are moist. No angioedema.     Pharynx: Oropharynx is clear. Uvula midline. No pharyngeal swelling, posterior oropharyngeal erythema or uvula swelling.  Eyes:     Pupils: Pupils are equal, round, and reactive to light.  Cardiovascular:     Rate and Rhythm: Normal rate and regular rhythm.  Pulmonary:     Effort: Pulmonary effort is normal.     Breath sounds: Normal breath sounds.  Abdominal:     General: Bowel sounds are normal. There is no distension.  Palpations: Abdomen is soft.  There is no mass.     Tenderness: There is no abdominal tenderness. There is no guarding.  Skin:    General: Skin is warm and dry.  Neurological:     General: No focal deficit present.     Mental Status: He is alert and oriented to person, place, and time.  Psychiatric:        Mood and Affect: Mood normal.        Behavior: Behavior normal.      UC Treatments / Results  Labs (all labs ordered are listed, but only abnormal results are displayed) Labs Reviewed - No data to display  EKG   Radiology No results found.  Procedures Procedures (including critical care time)  Medications Ordered in UC Medications - No data to display  Initial Impression / Assessment and Plan / UC Course  I have reviewed the triage vital signs and the nursing notes.  Pertinent labs & imaging results that were available during my care of the patient were reviewed by me and considered in my medical decision making (see chart for details).     Reviewed symptoms and exam with patient.  No red flags.  Vital signs stable.  Advised patient to continue omeprazole he is only been on it for 4 days.  After 2 weeks he can increase it to twice daily if he feels like his symptoms are not improving.  He is to continue lean meats and vegetables and avoidance of carbonation, alcohol, fried/spicy foods.  He is also encouraged to continue marijuana cessation.  Chart review shows he has an appoint with cardiology in October but I do not see any GI appointments.  I did place a referral for GI so that he can get established with one for further workup of the symptoms.  He is to follow-up with his PCP as scheduled appointment on September 17.  Strict ER precautions reviewed and patient verbalized understanding. Final Clinical Impressions(s) / UC Diagnoses   Final diagnoses:  Belching     Discharge Instructions      Continue the omeprazole once daily for the next 2 weeks.  If you feel like your symptoms or not improved  at that point you may increase it to twice daily.  Continue avoiding carbonation, fried/processed foods.  You should follow-up with your PCP at your scheduled appointment in September.  I have referred you to gastroenterology as well for further workup of the symptoms.  I do hope you feel better soon.   ED Prescriptions   None    PDMP not reviewed this encounter.   Radford Pax, NP 12/13/22 1000

## 2022-12-13 NOTE — ED Triage Notes (Signed)
Pt presents with c/o acid reflux x 3 wks. States he has been on omeprazole. States it has not helped. Pt states he has been watching what he eats.

## 2022-12-15 ENCOUNTER — Other Ambulatory Visit: Payer: Self-pay

## 2022-12-15 ENCOUNTER — Emergency Department (HOSPITAL_BASED_OUTPATIENT_CLINIC_OR_DEPARTMENT_OTHER)
Admission: EM | Admit: 2022-12-15 | Discharge: 2022-12-15 | Disposition: A | Discharge status: Home / Self Care | Payer: Medicaid Other | Attending: Emergency Medicine | Admitting: Emergency Medicine

## 2022-12-15 ENCOUNTER — Emergency Department (HOSPITAL_BASED_OUTPATIENT_CLINIC_OR_DEPARTMENT_OTHER): Payer: Medicaid Other

## 2022-12-15 ENCOUNTER — Encounter (HOSPITAL_BASED_OUTPATIENT_CLINIC_OR_DEPARTMENT_OTHER): Payer: Self-pay

## 2022-12-15 DIAGNOSIS — Z7951 Long term (current) use of inhaled steroids: Secondary | ICD-10-CM | POA: Insufficient documentation

## 2022-12-15 DIAGNOSIS — R0602 Shortness of breath: Secondary | ICD-10-CM | POA: Diagnosis not present

## 2022-12-15 DIAGNOSIS — K219 Gastro-esophageal reflux disease without esophagitis: Secondary | ICD-10-CM | POA: Diagnosis not present

## 2022-12-15 DIAGNOSIS — Z20822 Contact with and (suspected) exposure to covid-19: Secondary | ICD-10-CM | POA: Diagnosis not present

## 2022-12-15 DIAGNOSIS — J45909 Unspecified asthma, uncomplicated: Secondary | ICD-10-CM | POA: Insufficient documentation

## 2022-12-15 DIAGNOSIS — R0789 Other chest pain: Secondary | ICD-10-CM | POA: Diagnosis not present

## 2022-12-15 DIAGNOSIS — I700 Atherosclerosis of aorta: Secondary | ICD-10-CM | POA: Diagnosis not present

## 2022-12-15 LAB — CBC
HCT: 44.2 % (ref 39.0–52.0)
Hemoglobin: 14.8 g/dL (ref 13.0–17.0)
MCH: 27.5 pg (ref 26.0–34.0)
MCHC: 33.5 g/dL (ref 30.0–36.0)
MCV: 82.2 fL (ref 80.0–100.0)
Platelets: 303 10*3/uL (ref 150–400)
RBC: 5.38 MIL/uL (ref 4.22–5.81)
RDW: 12.4 % (ref 11.5–15.5)
WBC: 7.3 10*3/uL (ref 4.0–10.5)
nRBC: 0 % (ref 0.0–0.2)

## 2022-12-15 LAB — COMPREHENSIVE METABOLIC PANEL
ALT: 18 U/L (ref 0–44)
AST: 20 U/L (ref 15–41)
Albumin: 4.7 g/dL (ref 3.5–5.0)
Alkaline Phosphatase: 57 U/L (ref 38–126)
Anion gap: 11 (ref 5–15)
BUN: 23 mg/dL — ABNORMAL HIGH (ref 6–20)
CO2: 25 mmol/L (ref 22–32)
Calcium: 10.3 mg/dL (ref 8.9–10.3)
Chloride: 99 mmol/L (ref 98–111)
Creatinine, Ser: 1.33 mg/dL — ABNORMAL HIGH (ref 0.61–1.24)
GFR, Estimated: >60 mL/min (ref >60)
Glucose, Bld: 93 mg/dL (ref 70–99)
Potassium: 4 mmol/L (ref 3.5–5.1)
Sodium: 135 mmol/L (ref 135–145)
Total Bilirubin: 0.6 mg/dL (ref 0.3–1.2)
Total Protein: 7.5 g/dL (ref 6.5–8.1)

## 2022-12-15 LAB — RESP PANEL BY RT-PCR (RSV, FLU A&B, COVID)  RVPGX2
Influenza A by PCR: NEGATIVE
Influenza B by PCR: NEGATIVE
Resp Syncytial Virus by PCR: NEGATIVE
SARS Coronavirus 2 by RT PCR: NEGATIVE

## 2022-12-15 LAB — TROPONIN I (HIGH SENSITIVITY): Troponin I (High Sensitivity): 11 ng/L (ref <18)

## 2022-12-15 NOTE — ED Provider Notes (Signed)
[place] EMERGENCY DEPARTMENT AT [facility] Provider Note  CSN: [id number] Arrival date & time: 12/15/22 0125  Chief Complaint(s) Shortness of Breath  HPI Todd Barajas is a 27 y.o. male with a past medical history listed below who presents to the emergency department with shortness of breath that began after having acid reflux.  Patient reports a history of belching after eating.  Also reports a history of acid reflux.  Tinnitus symptoms began after having fried chicken.  Patient reported feeling a tightness in his chest similar to prior discomfort with reflux.  Also felt tightness in his throat.  Reports that his symptoms resolved after taking Mylanta but they returned within 30 minutes prompting his visit.  Currently he is asymptomatic.  Denies any recent fevers or infections.  No nausea or vomiting.  No abdominal pain.  No other physical complaints.  The history is provided by the patient.    Past Medical History Past Medical History:  Diagnosis Date   Asthma    There are no problems to display for this patient.  Home Medication(s) Prior to Admission medications   Medication Sig Start Date End Date Taking? Authorizing Provider  amoxicillin-clavulanate (AUGMENTIN) 875-125 MG tablet Take 1 tablet by mouth every 12 (twelve) hours. 05/22/19   Lorelee New, PA-C  cyclobenzaprine (FLEXERIL) 10 MG tablet Take 1 tablet (10 mg total) by mouth 2 (two) times daily as needed for muscle spasms. 07/03/22   Alvira Monday, MD  diphenhydrAMINE (BENADRYL) 25 MG tablet Take 1 tablet (25 mg total) by mouth every 6 (six) hours as needed. 08/16/19   Mannie Stabile, PA-C  fluticasone (FLONASE) 50 MCG/ACT nasal spray Place 2 sprays into both nostrils daily for 21 days. 05/22/19 06/12/19  Lorelee New, PA-C  hydrOXYzine (ATARAX/VISTARIL) 25 MG tablet Take 1 tablet (25 mg total) by mouth every 6 (six) hours as needed. 04/30/19   Dione Booze, MD  ibuprofen (ADVIL) 600 MG tablet Take 1  tablet (600 mg total) by mouth every 6 (six) hours as needed. 08/26/22   Sherian Maroon A, PA  lidocaine (LIDODERM) 5 % Place 1 patch onto the skin daily. Remove & Discard patch within 12 hours or as directed by MD 07/03/22   Alvira Monday, MD  naproxen sodium (ALEVE) 220 MG tablet Take 220 mg by mouth as needed.    [provider]  omeprazole (PRILOSEC) 20 MG capsule Take 1 capsule (20 mg total) by mouth daily. 12/10/22   Achille Rich, PA-C  permethrin (ELIMITE) 5 % cream Apply to affected area once 11/04/19   Khatri, Hina, PA-C  triamcinolone cream (KENALOG) 0.1 % Apply 1 application topically 2 (two) times daily. 10/14/19   Couture, Cortni S, PA-C                                                                                                                                    Allergies Patient has no  known allergies.  Review of Systems Review of Systems As noted in HPI  Physical Exam Vital Signs  I have reviewed the triage vital signs BP 126/84   Pulse 69   Temp 97.7 F (36.5 C) (Oral)   Resp 15   Ht 5\' 11"  (1.803 m)   Wt 96.2 kg   SpO2 99%   BMI 29.57 kg/m   Physical Exam Vitals reviewed.  Constitutional:      General: He is not in acute distress.    Appearance: He is well-developed. He is not diaphoretic.  HENT:     Head: Normocephalic and atraumatic.     Nose: Nose normal.     Mouth/Throat:     Mouth: No angioedema.     Pharynx: Oropharynx is clear. No uvula swelling.  Eyes:     General: No scleral icterus.       Right eye: No discharge.        Left eye: No discharge.     Conjunctiva/sclera: Conjunctivae normal.     Pupils: Pupils are equal, round, and reactive to light.  Cardiovascular:     Rate and Rhythm: Normal rate and regular rhythm.     Heart sounds: No murmur heard.    No friction rub. No gallop.  Pulmonary:     Effort: Pulmonary effort is normal. No respiratory distress.     Breath sounds: Normal breath sounds. No stridor. No rales.   Abdominal:     General: There is no distension.     Palpations: Abdomen is soft.     Tenderness: There is no abdominal tenderness.  Musculoskeletal:        General: No tenderness.     Cervical back: Normal range of motion and neck supple.  Skin:    General: Skin is warm and dry.     Findings: No erythema or rash.  Neurological:     Mental Status: He is alert and oriented to person, place, and time.     ED Results and Treatments Labs (all labs ordered are listed, but only abnormal results are displayed) Labs Reviewed  COMPREHENSIVE METABOLIC PANEL - Abnormal; Notable for the following components:      Result Value   BUN 23 (*)    Creatinine, Ser 1.33 (*)    All other components within normal limits  RESP PANEL BY RT-PCR (RSV, FLU A&B, COVID)  RVPGX2  CBC  TROPONIN I (HIGH SENSITIVITY)  TROPONIN I (HIGH SENSITIVITY)                                                                                                                         EKG   Radiology DG Chest Port 1 View  Result Date: 12/15/2022 CLINICAL DATA:  Shortness of breath.  History of asthma EXAM: PORTABLE CHEST 1 VIEW COMPARISON:  Radiographs 12/09/2022 FINDINGS: Stable cardiomediastinal silhouette. Aortic atherosclerotic calcification. No focal consolidation, pleural effusion, or pneumothorax. No displaced rib fractures. IMPRESSION: No acute cardiopulmonary disease. Electronically Signed  By: Minerva Fester M.D.   On: 12/15/2022 02:28    Medications Ordered in ED Medications - No data to display Procedures Procedures  (including critical care time) Medical Decision Making / ED Course   Medical Decision Making Amount and/or Complexity of Data Reviewed Labs: ordered. Decision-making details documented in ED Course. Radiology: ordered and independent interpretation performed. Decision-making details documented in ED Course. ECG/medicine tests: ordered and independent interpretation performed. Decision-making  details documented in ED Course.    Chest pain and shortness of breath  Differential listed below  Atypical for ACS.  EKG without acute ischemic changes, dysrhythmias or blocks.  Troponin more than 3 hours after onset of pain negative.  No need for repeat.  Unlikely ACS.  Presentation not classic for aortic dissection or esophageal perforation.  Low suspicion for pulmonary embolism.  COVID/influenza negative. CBC without leukocytosis or anemia. Metabolic panel without significant electrolyte derangements.  Mild renal insufficiency without evidence of AKI.  No biliary obstruction.  Chest x-ray without evidence of pneumonia, pneumothorax, pulmonary edema pleural effusion.     Final Clinical Impression(s) / ED Diagnoses Final diagnoses:  Chest pain due to GERD   The patient appears reasonably screened and/or stabilized for discharge and I doubt any other medical condition or other [employer] requiring further screening, evaluation, or treatment in the ED at this time. I have discussed the findings, Dx and Tx plan with the patient/family who expressed understanding and agree(s) with the plan. Discharge instructions discussed at length. The patient/family was given strict return precautions who verbalized understanding of the instructions. No further questions at time of discharge.  Disposition: Discharge  Condition: Good  ED Discharge Orders     None        This chart was dictated using voice recognition software.  Despite best efforts to proofread,  errors can occur which can change the documentation meaning.    Nira Conn, MD 12/15/22 [phone]

## 2022-12-15 NOTE — ED Triage Notes (Signed)
Pt arrived POV for SOB for the past 2 hours, hx of asthma, pt repots this feels different. Pt reports on-going CP that feels like "acid reflux they been seeing me for".  Pt reports throat burning and pressure to chest. Reports stop smoking 2 weeks ago. VSS, bilateral breath sound clear. Pt reports his belching has also return for the past 2 hours, recently seen for that as well. Pt appears slightly irritated.

## 2022-12-19 ENCOUNTER — Ambulatory Visit (INDEPENDENT_AMBULATORY_CARE_PROVIDER_SITE_OTHER): Payer: Medicaid Other | Admitting: Family

## 2022-12-19 VITALS — BP 130/80 | HR 54 | Temp 98.0°F | Ht 71.0 in | Wt 215.2 lb

## 2022-12-19 DIAGNOSIS — R142 Eructation: Secondary | ICD-10-CM | POA: Diagnosis not present

## 2022-12-19 DIAGNOSIS — R1310 Dysphagia, unspecified: Secondary | ICD-10-CM | POA: Insufficient documentation

## 2022-12-19 DIAGNOSIS — R490 Dysphonia: Secondary | ICD-10-CM | POA: Insufficient documentation

## 2022-12-19 DIAGNOSIS — R0789 Other chest pain: Secondary | ICD-10-CM

## 2022-12-19 DIAGNOSIS — K219 Gastro-esophageal reflux disease without esophagitis: Secondary | ICD-10-CM

## 2022-12-19 DIAGNOSIS — Z1211 Encounter for screening for malignant neoplasm of colon: Secondary | ICD-10-CM | POA: Diagnosis not present

## 2022-12-19 DIAGNOSIS — Z9189 Other specified personal risk factors, not elsewhere classified: Secondary | ICD-10-CM | POA: Diagnosis not present

## 2022-12-19 DIAGNOSIS — F1290 Cannabis use, unspecified, uncomplicated: Secondary | ICD-10-CM | POA: Insufficient documentation

## 2022-12-19 DIAGNOSIS — Z1212 Encounter for screening for malignant neoplasm of rectum: Secondary | ICD-10-CM | POA: Diagnosis not present

## 2022-12-19 NOTE — Patient Instructions (Signed)
Welcome to [facility] at [place], It was a pleasure meeting you today!    As discussed, I have sent your refills to your pharmacy.  I have sent a referral to ***  Please schedule a *** month follow up visit today.    PLEASE NOTE: If you had any LAB tests please let us know if you have not heard back within a few days. You may see your results on MyChart before we have a chance to review them but we will give you a call once they are reviewed by us. If we ordered any REFERRALS today, please let us know if you have not heard from their office within the next week.  Let us know through MyChart if you are needing REFILLS, or have your pharmacy send us the request. You can also use MyChart to communicate with me or any office staff.  Please try these tips to maintain a healthy lifestyle: It is important that you exercise regularly at least 30 minutes 5 times a week. Think about what you will eat, plan ahead. Choose whole foods, & think  "clean, green, fresh or frozen" over canned, processed or packaged foods which are more sugary, salty, and fatty. 70 to 75% of food eaten should be fresh vegetables and protein. 2-3  meals daily with healthy snacks between meals, but must be whole fruit, protein or vegetables. Aim to eat over a 10 hour period when you are active, for example, 7am to 5pm, and then STOP after your last meal of the day, drinking only water.  Shorter eating windows, 6-8 hours, are showing benefits in heart disease and blood sugar regulation. Drink water every day! Shoot for 64 ounces daily = 8 cups, no other drink is as healthy! Fruit juice is best enjoyed in a healthy way, by EATING the fruit.   

## 2022-12-19 NOTE — Progress Notes (Unsigned)
   New Patient Office Visit  Subjective:  Patient ID: Todd Barajas, male    DOB: 02-21-1996  Age: 27 y.o. MRN: [id number]  CC: No chief complaint on file.   HPI Todd Barajas presents for establishing care today.  Assessment & Plan:  There are no diagnoses linked to this encounter.  Subjective:    Outpatient Medications Prior to Visit  Medication Sig Dispense Refill   cyclobenzaprine (FLEXERIL) 10 MG tablet Take 1 tablet (10 mg total) by mouth 2 (two) times daily as needed for muscle spasms. 20 tablet 0   ibuprofen (ADVIL) 600 MG tablet Take 1 tablet (600 mg total) by mouth every 6 (six) hours as needed. 30 tablet 0   omeprazole (PRILOSEC) 20 MG capsule Take 1 capsule (20 mg total) by mouth daily. 14 capsule 0   amoxicillin-clavulanate (AUGMENTIN) 875-125 MG tablet Take 1 tablet by mouth every 12 (twelve) hours. 14 tablet 0   diphenhydrAMINE (BENADRYL) 25 MG tablet Take 1 tablet (25 mg total) by mouth every 6 (six) hours as needed. 30 tablet 0   fluticasone (FLONASE) 50 MCG/ACT nasal spray Place 2 sprays into both nostrils daily for 21 days. 9.9 mL 2   hydrOXYzine (ATARAX/VISTARIL) 25 MG tablet Take 1 tablet (25 mg total) by mouth every 6 (six) hours as needed. 60 tablet 0   lidocaine (LIDODERM) 5 % Place 1 patch onto the skin daily. Remove & Discard patch within 12 hours or as directed by MD 30 patch 0   naproxen sodium (ALEVE) 220 MG tablet Take 220 mg by mouth as needed.     permethrin (ELIMITE) 5 % cream Apply to affected area once 60 g 0   triamcinolone cream (KENALOG) 0.1 % Apply 1 application topically 2 (two) times daily. 30 g 0   No facility-administered medications prior to visit.   Past Medical History:  Diagnosis Date   Asthma    No past surgical history on file.  Objective:   Today's Vitals: There were no vitals taken for this visit.  Physical Exam Vitals and nursing note reviewed.  Constitutional:      General: He is not in acute distress.    Appearance:  Normal appearance.  HENT:     Head: Normocephalic.  Cardiovascular:     Rate and Rhythm: Normal rate and regular rhythm.  Pulmonary:     Effort: Pulmonary effort is normal.     Breath sounds: Normal breath sounds.  Musculoskeletal:        General: Normal range of motion.     Cervical back: Normal range of motion.  Skin:    General: Skin is warm and dry.  Neurological:     Mental Status: He is alert and oriented to person, place, and time.  Psychiatric:        Mood and Affect: Mood normal.     No orders of the defined types were placed in this encounter.   Dulce Sellar, NP

## 2022-12-20 DIAGNOSIS — K219 Gastro-esophageal reflux disease without esophagitis: Secondary | ICD-10-CM | POA: Insufficient documentation

## 2022-12-20 NOTE — Assessment & Plan Note (Signed)
Sx started in 08/2022 many ED visits started Omeprazole 20mg  every day not helping seen by GI this am, given a new RX & scheduled for Endo/Colonoscopy next Wed advised on low acid diet, less caffeine, less dairy will continue to follow f/u prn

## 2022-12-22 DIAGNOSIS — R9431 Abnormal electrocardiogram [ECG] [EKG]: Secondary | ICD-10-CM | POA: Diagnosis not present

## 2022-12-22 DIAGNOSIS — Z8249 Family history of ischemic heart disease and other diseases of the circulatory system: Secondary | ICD-10-CM | POA: Diagnosis not present

## 2022-12-22 DIAGNOSIS — I517 Cardiomegaly: Secondary | ICD-10-CM | POA: Diagnosis not present

## 2022-12-22 DIAGNOSIS — Z13220 Encounter for screening for lipoid disorders: Secondary | ICD-10-CM | POA: Diagnosis not present

## 2022-12-22 DIAGNOSIS — R079 Chest pain, unspecified: Secondary | ICD-10-CM | POA: Diagnosis not present

## 2022-12-22 DIAGNOSIS — R42 Dizziness and giddiness: Secondary | ICD-10-CM | POA: Diagnosis not present

## 2022-12-22 DIAGNOSIS — R002 Palpitations: Secondary | ICD-10-CM | POA: Diagnosis not present

## 2022-12-22 DIAGNOSIS — Z01818 Encounter for other preprocedural examination: Secondary | ICD-10-CM | POA: Diagnosis not present

## 2022-12-23 DIAGNOSIS — Z01818 Encounter for other preprocedural examination: Secondary | ICD-10-CM | POA: Diagnosis not present

## 2022-12-23 DIAGNOSIS — R9431 Abnormal electrocardiogram [ECG] [EKG]: Secondary | ICD-10-CM | POA: Diagnosis not present

## 2022-12-23 DIAGNOSIS — R002 Palpitations: Secondary | ICD-10-CM | POA: Diagnosis not present

## 2022-12-23 DIAGNOSIS — R42 Dizziness and giddiness: Secondary | ICD-10-CM | POA: Diagnosis not present

## 2022-12-23 DIAGNOSIS — R079 Chest pain, unspecified: Secondary | ICD-10-CM | POA: Diagnosis not present

## 2022-12-25 DIAGNOSIS — Z419 Encounter for procedure for purposes other than remedying health state, unspecified: Secondary | ICD-10-CM | POA: Diagnosis not present

## 2022-12-27 ENCOUNTER — Encounter: Payer: Self-pay | Admitting: Family

## 2022-12-27 ENCOUNTER — Ambulatory Visit (INDEPENDENT_AMBULATORY_CARE_PROVIDER_SITE_OTHER): Payer: Medicaid Other | Admitting: Family

## 2022-12-27 VITALS — BP 114/72 | HR 62 | Temp 98.2°F | Ht 71.0 in | Wt 207.0 lb

## 2022-12-27 DIAGNOSIS — R519 Headache, unspecified: Secondary | ICD-10-CM

## 2022-12-27 DIAGNOSIS — R7989 Other specified abnormal findings of blood chemistry: Secondary | ICD-10-CM

## 2022-12-27 DIAGNOSIS — E041 Nontoxic single thyroid nodule: Secondary | ICD-10-CM

## 2022-12-27 MED ORDER — TRIAMCINOLONE ACETONIDE 55 MCG/ACT NA AERO
1.0000 | INHALATION_SPRAY | Freq: Every day | NASAL | 2 refills | Status: AC
Start: 2022-12-27 — End: ?

## 2022-12-27 NOTE — Progress Notes (Signed)
Patient ID: Todd Barajas, male    DOB: [birth year], 27 y.o.   MRN: [id number]  Chief Complaint  Patient presents with   Thyroid Problem    Pt in office to discuss CT for thyroid nodule; requesting blood work for kidney; was in hospital last month, D with reflux but states it is causing issues with throat, and SOB. Pt has upcoming colonoscopy and endoscopy.    HPI: Neck & headache: persistent pain on his left side of his head and behind his eyes. Reports chest pain is gone. Pt reports hx of 4mm nodule on thyroid in 2020. States he feels a sensation in his throat like something is there, denies dysphagia or pain. He would just like to check the status of nodule. Reports pain on left side of his head around the back and top of ear to his left temple, and behind his left eye. Pain is achy, has taken Aleve with relief, but pain returns, has been persistent for several days. He has had some allergy sx recently with runny nose. Has not tried any OTC meds. Overall he feels like his health is doing better, does not feel as anxious.  Assessment & Plan:  Thyroid nodule, uninodular- ordering U/S, last one done in 2020, reports 4mm in size, unsure which side.  -     US THYROID; Future  High serum creatinine- noted in last ED visit, pt reports trying to drink more water, not taking any nephrotoxic meds. Rechecking BMP today.  -     Basic metabolic panel  Sinus headache- advised ok to continue 1-2 Aleve bid prn for next few days. Also sending Nasacort to r/o sinus headache, based on sx, advised on use & SE.  -     Triamcinolone Acetonide; Place 1 spray into the nose daily. Start with 1 spray each side twice a day for 3 days, then reduce to daily.  Dispense: 1 each; Refill: 2  Subjective:    Outpatient Medications Prior to Visit  Medication Sig Dispense Refill   omeprazole (PRILOSEC) 20 MG capsule Take 1 capsule (20 mg total) by mouth daily. (Patient not taking: Reported on 12/27/2022) 14 capsule 0    cyclobenzaprine (FLEXERIL) 10 MG tablet Take 1 tablet (10 mg total) by mouth 2 (two) times daily as needed for muscle spasms. (Patient not taking: Reported on 12/27/2022) 20 tablet 0   ibuprofen (ADVIL) 600 MG tablet Take 1 tablet (600 mg total) by mouth every 6 (six) hours as needed. (Patient not taking: Reported on 12/27/2022) 30 tablet 0   No facility-administered medications prior to visit.   Past Medical History:  Diagnosis Date   Asthma    History reviewed. No pertinent surgical history. No Known Allergies    Objective:    Physical Exam Vitals and nursing note reviewed.  Constitutional:      General: He is not in acute distress.    Appearance: Normal appearance.  HENT:     Head: Normocephalic.     Nose: No rhinorrhea.     Left Nostril: No occlusion.     Right Sinus: No frontal sinus tenderness.     Left Sinus: Frontal sinus tenderness present.  Cardiovascular:     Rate and Rhythm: Normal rate and regular rhythm.  Pulmonary:     Effort: Pulmonary effort is normal.     Breath sounds: Normal breath sounds.  Musculoskeletal:        General: Normal range of motion.     Cervical back: Normal range  of motion.  Lymphadenopathy:     Cervical: No cervical adenopathy.  Skin:    General: Skin is warm and dry.  Neurological:     Mental Status: He is alert and oriented to person, place, and time.  Psychiatric:        Mood and Affect: Mood normal.    BP 114/72 (BP Location: Left Arm)   Pulse 62   Temp 98.2 F (36.8 C) (Temporal)   Ht 5\' 11"  (1.803 m)   Wt 207 lb (93.9 kg)   SpO2 96%   BMI 28.87 kg/m  Wt Readings from Last 3 Encounters:  12/27/22 207 lb (93.9 kg)  12/19/22 215 lb 3.2 oz (97.6 kg)  12/15/22 212 lb (96.2 kg)      Dulce Sellar, NP

## 2022-12-27 NOTE — Patient Instructions (Addendum)
It was very nice to see you today!   I will review your lab results via MyChart in a few days. I have sent over the steroid nasal spray to your pharmacy, to help with your headache, if related to allergies, use as directed on the bottle. You can also take Aleve or generic Aleve 1-2 tab twice a day for pain for the next few days. I have ordered the ultrasound to recheck your thyroid nodule.  Good luck with the endoscopy procedures tomorrow, hope all goes well!      PLEASE NOTE:  If you had any lab tests please let us know if you have not heard back within a few days. You may see your results on MyChart before we have a chance to review them but we will give you a call once they are reviewed by [place]. If we ordered any referrals today, please let us know if you have not heard from their office within the next week.

## 2022-12-27 NOTE — Addendum Note (Signed)
Addended by: Lorn Junes on: 12/27/2022 09:58 AM   Modules accepted: Orders

## 2022-12-28 DIAGNOSIS — K635 Polyp of colon: Secondary | ICD-10-CM | POA: Diagnosis not present

## 2022-12-28 DIAGNOSIS — Z800 Family history of malignant neoplasm of digestive organs: Secondary | ICD-10-CM | POA: Diagnosis not present

## 2022-12-28 DIAGNOSIS — Z1211 Encounter for screening for malignant neoplasm of colon: Secondary | ICD-10-CM | POA: Diagnosis not present

## 2022-12-28 DIAGNOSIS — R1310 Dysphagia, unspecified: Secondary | ICD-10-CM | POA: Diagnosis not present

## 2022-12-28 DIAGNOSIS — D120 Benign neoplasm of cecum: Secondary | ICD-10-CM | POA: Diagnosis not present

## 2022-12-28 DIAGNOSIS — K2950 Unspecified chronic gastritis without bleeding: Secondary | ICD-10-CM | POA: Diagnosis not present

## 2022-12-28 DIAGNOSIS — R1013 Epigastric pain: Secondary | ICD-10-CM | POA: Diagnosis not present

## 2022-12-28 LAB — BASIC METABOLIC PANEL
BUN: 7 mg/dL (ref 7–25)
CO2: 21 mmol/L (ref 20–32)
Calcium: 9.8 mg/dL (ref 8.6–10.3)
Chloride: 103 mmol/L (ref 98–110)
Creat: 1.18 mg/dL (ref 0.60–1.24)
Glucose, Bld: 83 mg/dL (ref 65–99)
Potassium: 4.5 mmol/L (ref 3.5–5.3)
Sodium: 138 mmol/L (ref 135–146)

## 2022-12-29 ENCOUNTER — Ambulatory Visit
Admission: RE | Admit: 2022-12-29 | Discharge: 2022-12-29 | Disposition: A | Discharge status: Home / Self Care | Payer: Medicaid Other | Source: Ambulatory Visit | Attending: Family | Admitting: Family

## 2022-12-29 DIAGNOSIS — R9431 Abnormal electrocardiogram [ECG] [EKG]: Secondary | ICD-10-CM | POA: Diagnosis not present

## 2022-12-29 DIAGNOSIS — R079 Chest pain, unspecified: Secondary | ICD-10-CM | POA: Diagnosis not present

## 2022-12-29 DIAGNOSIS — Z8249 Family history of ischemic heart disease and other diseases of the circulatory system: Secondary | ICD-10-CM | POA: Diagnosis not present

## 2022-12-29 DIAGNOSIS — E041 Nontoxic single thyroid nodule: Secondary | ICD-10-CM

## 2022-12-30 ENCOUNTER — Encounter: Payer: Self-pay | Admitting: Family

## 2022-12-30 ENCOUNTER — Ambulatory Visit (INDEPENDENT_AMBULATORY_CARE_PROVIDER_SITE_OTHER): Payer: Medicaid Other | Admitting: Family

## 2022-12-30 VITALS — BP 114/74 | HR 52 | Temp 98.0°F | Ht 71.0 in | Wt 209.0 lb

## 2022-12-30 DIAGNOSIS — K219 Gastro-esophageal reflux disease without esophagitis: Secondary | ICD-10-CM

## 2022-12-30 MED ORDER — OMEPRAZOLE 20 MG PO CPDR
20.0000 mg | DELAYED_RELEASE_CAPSULE | Freq: Every day | ORAL | 2 refills | Status: AC
Start: 2022-12-30 — End: ?

## 2022-12-30 NOTE — Progress Notes (Signed)
Patient ID: Todd Barajas, male    DOB: 1995-04-28, 27 y.o.   MRN: [id number]  Chief Complaint  Patient presents with   Thyroid Problem   Gastroesophageal Reflux    Pt c/o GERD worse after procedure that was done on 9/4.     HPI: GERD/Chest pain:  HX:  last seen in ED on 8/22 for sx, and has had several ED visits for same sx going back to May. He started Omeprazole 20mg  daily, but states this has not helped. He made his own GI appt and was seen this am by [facility] and was prescribed a "cocktail" but he is unsure what it is. He is scheduled for Endo/Colon procedure next Wednesday. He also scheduled his own Cardiology appt w/[facility] which is Thursday d/t concern of sharp pains in his left chest and side, also reports palpitations and has had 2 EKGs done with different results, first indicated Left atrial enlargement and 2nd showed bradycardia and atrial arrhythmia. He reports working out 3-4d/week, mix of [employer] & cardio. *Today, pt had EGD/Colon. procedures 2d ago and 2 polyps found, waiting on pathology, advised to return in 58yr. EGD normal, pt states he has had bad reflux last 2 days, throat is sore and pain in upper back after procedures.  Assessment & Plan:  Gastroesophageal reflux disease without esophagitis Assessment & Plan: Sx started in 08/2022 many ED visits reported Omeprazole 20mg  every day was not helping seen by GI & had EGD & colonoscopy done, EGD normal states reflux worse last 2 days advised not abnormal to have throat soreness & reflux after procedure, needs to resume Omeprazole sending refill, reminded pt on use & SE advised again on low acid diet, less caffeine, less dairy will continue to follow f/u 3-73mos  Orders: -     Omeprazole; Take 1 capsule (20 mg total) by mouth daily. Take twice a day for 1 week then reduce to daily.  Dispense: 45 capsule; Refill: 2    Subjective:    Outpatient Medications Prior to Visit  Medication Sig Dispense Refill    triamcinolone (NASACORT) 55 MCG/ACT AERO nasal inhaler Place 1 spray into the nose daily. Start with 1 spray each side twice a day for 3 days, then reduce to daily. 1 each 2   omeprazole (PRILOSEC) 20 MG capsule Take 1 capsule (20 mg total) by mouth daily. (Patient not taking: Reported on 12/30/2022) 14 capsule 0   No facility-administered medications prior to visit.   Past Medical History:  Diagnosis Date   Asthma    No past surgical history on file. No Known Allergies    Objective:    Physical Exam Vitals and nursing note reviewed.  Constitutional:      General: He is not in acute distress.    Appearance: Normal appearance.  HENT:     Head: Normocephalic.  Cardiovascular:     Rate and Rhythm: Normal rate and regular rhythm.  Pulmonary:     Effort: Pulmonary effort is normal.     Breath sounds: Normal breath sounds.  Musculoskeletal:        General: Normal range of motion.     Cervical back: Normal range of motion.  Skin:    General: Skin is warm and dry.  Neurological:     Mental Status: He is alert and oriented to person, place, and time.  Psychiatric:        Mood and Affect: Mood normal.    BP 114/74 (BP Location: Left Arm, Patient  Position: Sitting, Cuff Size: Large)   Pulse (!) 52   Temp 98 F (36.7 C) (Temporal)   Ht 5\' 11"  (1.803 m)   Wt 209 lb (94.8 kg)   SpO2 98%   BMI 29.15 kg/m  Wt Readings from Last 3 Encounters:  12/30/22 209 lb (94.8 kg)  12/27/22 207 lb (93.9 kg)  12/19/22 215 lb 3.2 oz (97.6 kg)       Dulce Sellar, NP

## 2022-12-30 NOTE — Assessment & Plan Note (Signed)
Sx started in 08/2022 many ED visits reported Omeprazole 20mg  every day was not helping seen by GI & had EGD & colonoscopy done, EGD normal states reflux worse last 2 days advised not abnormal to have throat soreness & reflux after procedure, needs to resume Omeprazole sending refill, reminded pt on use & SE advised again on low acid diet, less caffeine, less dairy will continue to follow f/u 3-77mos

## 2023-01-04 NOTE — Progress Notes (Signed)
   Patient ID: Ragen Macarthur, male    DOB: Mar 09, 1996, 27 y.o.   MRN: [id number]  No chief complaint on file.  HPI:           Assessment & Plan:     Subjective:    Outpatient Medications Prior to Visit  Medication Sig Dispense Refill  . omeprazole (PRILOSEC) 20 MG capsule Take 1 capsule (20 mg total) by mouth daily. Take twice a day for 1 week then reduce to daily. 45 capsule 2  . triamcinolone (NASACORT) 55 MCG/ACT AERO nasal inhaler Place 1 spray into the nose daily. Start with 1 spray each side twice a day for 3 days, then reduce to daily. 1 each 2   No facility-administered medications prior to visit.   Past Medical History:  Diagnosis Date  . Asthma    No past surgical history on file. No Known Allergies    Objective:    Physical Exam There were no vitals taken for this visit. Wt Readings from Last 3 Encounters:  12/30/22 209 lb (94.8 kg)  12/27/22 207 lb (93.9 kg)  12/19/22 215 lb 3.2 oz (97.6 kg)       Dulce Sellar, NP

## 2023-01-05 ENCOUNTER — Encounter: Payer: Self-pay | Admitting: Family

## 2023-01-05 ENCOUNTER — Ambulatory Visit (INDEPENDENT_AMBULATORY_CARE_PROVIDER_SITE_OTHER): Payer: Medicaid Other | Admitting: Family

## 2023-01-05 VITALS — BP 126/85 | HR 74 | Temp 98.6°F | Ht 71.0 in | Wt 203.2 lb

## 2023-01-05 DIAGNOSIS — J029 Acute pharyngitis, unspecified: Secondary | ICD-10-CM

## 2023-01-05 DIAGNOSIS — B349 Viral infection, unspecified: Secondary | ICD-10-CM

## 2023-01-05 LAB — POCT RAPID STREP A (OFFICE): Rapid Strep A Screen: NEGATIVE

## 2023-01-06 ENCOUNTER — Ambulatory Visit: Payer: Medicaid Other | Admitting: Family

## 2023-01-10 ENCOUNTER — Ambulatory Visit: Payer: Medicaid Other | Admitting: Family

## 2023-01-12 ENCOUNTER — Encounter: Payer: Self-pay | Admitting: Family

## 2023-01-12 ENCOUNTER — Ambulatory Visit (INDEPENDENT_AMBULATORY_CARE_PROVIDER_SITE_OTHER): Payer: Medicaid Other | Admitting: Family

## 2023-01-12 VITALS — BP 126/79 | HR 58 | Temp 98.0°F | Ht 71.0 in | Wt 212.4 lb

## 2023-01-12 DIAGNOSIS — J45909 Unspecified asthma, uncomplicated: Secondary | ICD-10-CM | POA: Diagnosis not present

## 2023-01-12 DIAGNOSIS — R590 Localized enlarged lymph nodes: Secondary | ICD-10-CM

## 2023-01-12 DIAGNOSIS — Z01818 Encounter for other preprocedural examination: Secondary | ICD-10-CM | POA: Diagnosis not present

## 2023-01-12 DIAGNOSIS — R051 Acute cough: Secondary | ICD-10-CM

## 2023-01-12 DIAGNOSIS — R0789 Other chest pain: Secondary | ICD-10-CM | POA: Diagnosis not present

## 2023-01-12 DIAGNOSIS — K219 Gastro-esophageal reflux disease without esophagitis: Secondary | ICD-10-CM | POA: Diagnosis not present

## 2023-01-12 DIAGNOSIS — R002 Palpitations: Secondary | ICD-10-CM | POA: Diagnosis not present

## 2023-01-12 DIAGNOSIS — R079 Chest pain, unspecified: Secondary | ICD-10-CM | POA: Diagnosis not present

## 2023-01-12 DIAGNOSIS — R9431 Abnormal electrocardiogram [ECG] [EKG]: Secondary | ICD-10-CM | POA: Diagnosis not present

## 2023-01-12 DIAGNOSIS — J358 Other chronic diseases of tonsils and adenoids: Secondary | ICD-10-CM

## 2023-01-12 DIAGNOSIS — R42 Dizziness and giddiness: Secondary | ICD-10-CM | POA: Diagnosis not present

## 2023-01-12 NOTE — Progress Notes (Signed)
Patient ID: Todd Barajas, male    DOB: 1996/03/13, 27 y.o.   MRN: [id number]  Chief Complaint  Patient presents with   Neck Pain    Pt c/o slight left sided neck/throat pain.   *Discussed the use of AI scribe software for clinical note transcription with the patient, who gave verbal consent to proceed.  History of Present Illness   The patient, with a history of reflux, presents with persistent throat discomfort and cough. He reports a sensation of small balls in the throat area, which are identified as lymph nodes. The patient also notes the presence of white spots on the tonsils, which have persisted despite previous treatments. The cough is described as dry, and it is particularly noticeable in the mornings. He denies any pain when swallowing, but reports a constant pain in the throat area. He also mentions a history of reflux and is currently on omeprazole. The patient has been trying to manage his symptoms with good oral hygiene and a careful diet.     Assessment & Plan:     Tonsillitis with tonsil stones - Persistent white patches, tonsil stones on bilateral tonsils and localized pain. No dysphagia. -Recommend gargling with generic Peroxyl to help clear plaque and possible tonsil stones. -Attempt to dislodge tonsil stones with a long Q-tip if tolerated. -Obtain throat culture to rule out other bacterial causes.  Lymphadenopathy - Palpable superficial cervical lymph nodes on the left side, likely reactive to ongoing tonsillitis. -Expect resolution with treatment of tonsillitis.  Morning cough - Persistent morning cough, previously productive, now dry. No other symptoms of reflux or asthma. -Recommend using Nasacort at bedtime to see if it alleviates morning cough. -Consider taking omeprazole at bedtime to rule out possible reflux as a cause of morning cough.  Gastrointestinal concerns Previous symptoms of discomfort and belching have improved. Scheduled for gastric emptying scan  on 01/25/2023 to evaluate for possible gastroparesis. -Continue current management and follow up with results of gastric emptying scan.      Subjective:    Outpatient Medications Prior to Visit  Medication Sig Dispense Refill   omeprazole (PRILOSEC) 20 MG capsule Take 1 capsule (20 mg total) by mouth daily. Take twice a day for 1 week then reduce to daily. 45 capsule 2   triamcinolone (NASACORT) 55 MCG/ACT AERO nasal inhaler Place 1 spray into the nose daily. Start with 1 spray each side twice a day for 3 days, then reduce to daily. 1 each 2   No facility-administered medications prior to visit.   Past Medical History:  Diagnosis Date   Asthma    No past surgical history on file. No Known Allergies    Objective:    Physical Exam Vitals and nursing note reviewed.  Constitutional:      General: He is not in acute distress.    Appearance: Normal appearance.  HENT:     Head: Normocephalic.     Mouth/Throat:     Dentition: Normal dentition. No gingival swelling.     Tonsils: Tonsillar exudate (with a few tonsiloliths in bilateral tonsils) and tonsillar abscess present. 1+ on the right. 1+ on the left.  Cardiovascular:     Rate and Rhythm: Normal rate and regular rhythm.  Pulmonary:     Effort: Pulmonary effort is normal.     Breath sounds: Normal breath sounds.  Musculoskeletal:        General: Normal range of motion.     Cervical back: Normal range of motion.  Skin:  General: Skin is warm and dry.  Neurological:     Mental Status: He is alert and oriented to person, place, and time.  Psychiatric:        Mood and Affect: Mood normal.    BP 126/79 (BP Location: Left Arm, Patient Position: Sitting, Cuff Size: Large)   Pulse (!) 58   Temp 98 F (36.7 C) (Temporal)   Ht 5\' 11"  (1.803 m)   Wt 212 lb 6.4 oz (96.3 kg)   SpO2 99%   BMI 29.62 kg/m  Wt Readings from Last 3 Encounters:  01/12/23 212 lb 6.4 oz (96.3 kg)  01/05/23 203 lb 4 oz (92.2 kg)  12/30/22 209 lb  (94.8 kg)       Dulce Sellar, NP

## 2023-01-12 NOTE — Patient Instructions (Addendum)
It was very nice to see you today!   The small lumps in your left neck are enlarge lymph nodes and are responding to the irritation in your tonsils &  can take several weeks to resolve.  Try generic Peroxyl mouth rinse twice a day for a week to help your tonsil stones, pain. You can also try to remove the stones with long Q-tip or brush your tonsils very gently with a soft brush to remove the stones. Be sure you are brushing your teeth twice a day.  Let me know if still not improving.     PLEASE NOTE:  If you had any lab tests please let us know if you have not heard back within a few days. You may see your results on MyChart before we have a chance to review them but we will give you a call once they are reviewed by [place]. If we ordered any referrals today, please let us know if you have not heard from their office within the next week.

## 2023-01-12 NOTE — Assessment & Plan Note (Signed)
Sx started in 08/2022 many ED visits seen by GI & had EGD & colonoscopy done, EGD normal Scheduled for gastric emptying scan on 01/25/2023 to evaluate for possible gastroparesis. taking Omeprazole 20mg  daily advised to switch to at bedtime dosing d/t new sx of morning dry cough                                 advised again on low acid diet, less caffeine, no smoking f/u 3-30mos

## 2023-01-16 LAB — GC/CHLAMYDIA PROBE AMP
Chlamydia trachomatis, NAA: NEGATIVE
Neisseria Gonorrhoeae by PCR: NEGATIVE

## 2023-01-18 ENCOUNTER — Encounter (HOSPITAL_BASED_OUTPATIENT_CLINIC_OR_DEPARTMENT_OTHER): Payer: Self-pay

## 2023-01-18 ENCOUNTER — Emergency Department (HOSPITAL_BASED_OUTPATIENT_CLINIC_OR_DEPARTMENT_OTHER)
Admission: EM | Admit: 2023-01-18 | Discharge: 2023-01-18 | Disposition: A | Discharge status: Home / Self Care | Payer: Medicaid Other | Attending: Emergency Medicine | Admitting: Emergency Medicine

## 2023-01-18 ENCOUNTER — Other Ambulatory Visit: Payer: Self-pay

## 2023-01-18 DIAGNOSIS — E860 Dehydration: Secondary | ICD-10-CM | POA: Insufficient documentation

## 2023-01-18 DIAGNOSIS — R42 Dizziness and giddiness: Secondary | ICD-10-CM | POA: Diagnosis present

## 2023-01-18 DIAGNOSIS — J019 Acute sinusitis, unspecified: Secondary | ICD-10-CM | POA: Diagnosis not present

## 2023-01-18 LAB — MONONUCLEOSIS SCREEN: Mono Screen: NEGATIVE

## 2023-01-18 LAB — CBG MONITORING, ED: Glucose-Capillary: 108 mg/dL — ABNORMAL HIGH (ref 70–99)

## 2023-01-18 MED ORDER — AMOXICILLIN-POT CLAVULANATE 875-125 MG PO TABS: 1.0000 | ORAL_TABLET | Freq: Two times a day (BID) | ORAL | 0 refills | Status: DC

## 2023-01-18 MED ORDER — LACTATED RINGERS IV BOLUS
2000.0000 mL | Freq: Once | INTRAVENOUS | Status: AC
  Administered 2023-01-18: 2000 mL via INTRAVENOUS

## 2023-01-18 NOTE — ED Notes (Signed)
Pt discharged to home using teachback Method. Discharge instructions have been discussed with patient and/or family members. Pt verbally acknowledges understanding d/c instructions, has been given opportunity for questions to be answered, and endorses comprehension to checkout at registration before leaving.  

## 2023-01-18 NOTE — Discharge Instructions (Signed)
Your monotest is negative.  Continue to drink plenty of fluids.  For any concerning symptoms return to the emergency room.  Otherwise follow-up with your primary care provider.  I have sent antibiotic into the pharmacy to cover for sinus infection.  You can also perform a sinus rinse to help with the congestion and the postnasal drip that may be leading to your cough.

## 2023-01-18 NOTE — ED Provider Notes (Addendum)
[place] [facility]PART[facility] AT [facility] Provider Note   CSN: [id number] Arrival date & time: 01/18/23  0623     History  Chief Complaint  Patient presents with   Dizziness    Todd Barajas is a 27 y.o. male.  27 year old male presents today for concern of lightheadedness episode that occurred while he was at work.  He works as a Corporate investment banker.  He states he initially had the episode sat down and resolved.  He drinks of fluids and then stood back up and had another episode so he presented for evaluation.  No syncopal episode.  He is also mentioning lymph nodes, and tonsil stones that he was seen for by his PCP.  Also reports a dry cough primarily in the morning after waking up.  Endorses postnasal drip.  This has been ongoing for 2 weeks now.  No shortness of breath or chest pain.  The history is provided by the patient. No language interpreter was used.       Home Medications Prior to Admission medications   Medication Sig Start Date End Date Taking? Authorizing Provider  omeprazole (PRILOSEC) 20 MG capsule Take 1 capsule (20 mg total) by mouth daily. Take twice a day for 1 week then reduce to daily. 12/30/22   Dulce Sellar, NP  triamcinolone (NASACORT) 55 MCG/ACT AERO nasal inhaler Place 1 spray into the nose daily. Start with 1 spray each side twice a day for 3 days, then reduce to daily. 12/27/22   Dulce Sellar, NP      Allergies    Patient has no known allergies.    Review of Systems   Review of Systems  Constitutional:  Negative for chills and fever.  Respiratory:  Negative for shortness of breath.   Cardiovascular:  Negative for chest pain.  Gastrointestinal:  Negative for abdominal pain.  Genitourinary:  Negative for flank pain.  Neurological:  Positive for light-headedness.  All other systems reviewed and are negative.   Physical Exam Updated Vital Signs BP 119/76 (BP Location: Right Arm)   Pulse 71   Temp 97.7 F (36.5 C) (Oral)    Resp 15   Ht 5\' 11"  (1.803 m)   Wt 96.3 kg   SpO2 99%   BMI 29.62 kg/m  Physical Exam Vitals and nursing note reviewed.  Constitutional:      General: He is not in acute distress.    Appearance: Normal appearance. He is not ill-appearing.  HENT:     Head: Normocephalic and atraumatic.     Nose: Nose normal.     Mouth/Throat:     Mouth: Mucous membranes are moist.     Comments: Small tonsil stones noted on the left tonsil.  4 in total. Eyes:     Conjunctiva/sclera: Conjunctivae normal.  Cardiovascular:     Rate and Rhythm: Normal rate and regular rhythm.  Pulmonary:     Effort: Pulmonary effort is normal. No respiratory distress.  Musculoskeletal:        General: No deformity. Normal range of motion.     Cervical back: Normal range of motion.  Skin:    Findings: No rash.  Neurological:     Mental Status: He is alert.     ED Results / Procedures / Treatments   Labs (all labs ordered are listed, but only abnormal results are displayed) Labs Reviewed  CBG MONITORING, ED - Abnormal; Notable for the following components:      Result Value   Glucose-Capillary 108 (*)  All other components within normal limits    EKG EKG Interpretation Date/Time:  Wednesday January 18 2023 10:15:33 EDT Ventricular Rate:  57 PR Interval:  179 QRS Duration:  87 QT Interval:  380 QTC Calculation: 370 R Axis:   76  Text Interpretation: Sinus rhythm Borderline T abnormalities, inferior leads No significant change since last tracing Confirmed by Zadie Rhine ([id number]) on 01/18/2023 10:22:51 AM  Radiology No results found.  Procedures Procedures    Medications Ordered in ED Medications  lactated ringers bolus 2,000 mL (has no administration in time range)    ED Course/ Medical Decision Making/ A&P                                 Medical Decision Making Amount and/or Complexity of Data Reviewed Labs: ordered.  Risk Prescription drug management.   27 year old male  presents today for concern of lightheadedness.  This occurred while he was on the job site.  Endorses decreased fluid intake.  Will offer IV fluids.  Given his occupation, decreased fluid intake likely dehydration.  No prodromal symptoms.  No suspicion for arrhythmia.  Exam overall reassuring.  Tonsil stones noted on the left tonsil.  He states he also had tonsil stones on the right but those have resolved.  Does have postnasal drip and dry cough in the morning for the past 2 weeks.  Given the duration we will start patient on Augmentin for sinusitis.  He is also requesting a monotest.  Will provide this at today's visit.  Will reevaluate after fluids.  Otherwise he is well-appearing.  Hemodynamically stable.  No prodromal symptom surrounding the lightheadedness episode.  Improved after fluids.  He is appropriate for discharge.  Monotest negative.  Discussed increasing hydration after discharge.  Return precautions given.  Patient voices understanding and is in agreement with plan.   Final Clinical Impression(s) / ED Diagnoses Final diagnoses:  Acute sinusitis, recurrence not specified, unspecified location  Dehydration    Rx / DC Orders ED Discharge Orders          Ordered    amoxicillin-clavulanate (AUGMENTIN) 875-125 MG tablet  Every 12 hours        01/18/23 1257              Marita Kansas, PA-C 01/18/23 1300    Marita Kansas, PA-C 01/18/23 1441    Zadie Rhine, MD 01/18/23 1506

## 2023-01-18 NOTE — ED Triage Notes (Signed)
States at work today had two dizzy spells.  States being seen by PCP for swollen lymph nodes for about a week.

## 2023-01-21 DIAGNOSIS — Z23 Encounter for immunization: Secondary | ICD-10-CM | POA: Diagnosis not present

## 2023-01-21 DIAGNOSIS — S61259A Open bite of unspecified finger without damage to nail, initial encounter: Secondary | ICD-10-CM | POA: Diagnosis not present

## 2023-01-21 DIAGNOSIS — S61254A Open bite of right ring finger without damage to nail, initial encounter: Secondary | ICD-10-CM | POA: Diagnosis not present

## 2023-01-21 DIAGNOSIS — W540XXA Bitten by dog, initial encounter: Secondary | ICD-10-CM | POA: Diagnosis not present

## 2023-01-23 DIAGNOSIS — K219 Gastro-esophageal reflux disease without esophagitis: Secondary | ICD-10-CM | POA: Diagnosis not present

## 2023-01-23 DIAGNOSIS — M542 Cervicalgia: Secondary | ICD-10-CM | POA: Diagnosis not present

## 2023-01-23 DIAGNOSIS — R142 Eructation: Secondary | ICD-10-CM | POA: Diagnosis not present

## 2023-01-24 DIAGNOSIS — Z419 Encounter for procedure for purposes other than remedying health state, unspecified: Secondary | ICD-10-CM | POA: Diagnosis not present

## 2023-01-24 NOTE — Progress Notes (Unsigned)
   Patient ID: Todd Barajas, male    DOB: 05-28-1995, 28 y.o.   MRN: [id number]  No chief complaint on file.   HPI:    Assessment & Plan:  There are no diagnoses linked to this encounter.  Subjective:    Outpatient Medications Prior to Visit  Medication Sig Dispense Refill   amoxicillin-clavulanate (AUGMENTIN) 875-125 MG tablet Take 1 tablet by mouth every 12 (twelve) hours. 14 tablet 0   omeprazole (PRILOSEC) 20 MG capsule Take 1 capsule (20 mg total) by mouth daily. Take twice a day for 1 week then reduce to daily. 45 capsule 2   triamcinolone (NASACORT) 55 MCG/ACT AERO nasal inhaler Place 1 spray into the nose daily. Start with 1 spray each side twice a day for 3 days, then reduce to daily. 1 each 2   No facility-administered medications prior to visit.   Past Medical History:  Diagnosis Date   Asthma    No past surgical history on file. No Known Allergies    Objective:    Physical Exam Vitals and nursing note reviewed.  Constitutional:      General: He is not in acute distress.    Appearance: Normal appearance.  HENT:     Head: Normocephalic.  Cardiovascular:     Rate and Rhythm: Normal rate and regular rhythm.  Pulmonary:     Effort: Pulmonary effort is normal.     Breath sounds: Normal breath sounds.  Musculoskeletal:        General: Normal range of motion.     Cervical back: Normal range of motion.  Skin:    General: Skin is warm and dry.  Neurological:     Mental Status: He is alert and oriented to person, place, and time.  Psychiatric:        Mood and Affect: Mood normal.    There were no vitals taken for this visit. Wt Readings from Last 3 Encounters:  01/18/23 212 lb 6.4 oz (96.3 kg)  01/12/23 212 lb 6.4 oz (96.3 kg)  01/05/23 203 lb 4 oz (92.2 kg)       Dulce Sellar, NP

## 2023-01-25 ENCOUNTER — Ambulatory Visit (INDEPENDENT_AMBULATORY_CARE_PROVIDER_SITE_OTHER): Payer: Medicaid Other | Admitting: Family

## 2023-01-25 VITALS — BP 122/81 | HR 61 | Temp 99.0°F | Ht 71.0 in | Wt 199.0 lb

## 2023-01-25 DIAGNOSIS — M542 Cervicalgia: Secondary | ICD-10-CM | POA: Diagnosis not present

## 2023-01-25 DIAGNOSIS — K219 Gastro-esophageal reflux disease without esophagitis: Secondary | ICD-10-CM

## 2023-01-25 DIAGNOSIS — R590 Localized enlarged lymph nodes: Secondary | ICD-10-CM | POA: Diagnosis not present

## 2023-01-25 DIAGNOSIS — R140 Abdominal distension (gaseous): Secondary | ICD-10-CM | POA: Diagnosis not present

## 2023-01-25 DIAGNOSIS — R070 Pain in throat: Secondary | ICD-10-CM

## 2023-01-25 MED ORDER — OMEPRAZOLE 20 MG PO CPDR
20.0000 mg | DELAYED_RELEASE_CAPSULE | Freq: Every day | ORAL | 5 refills | Status: DC
Start: 2023-01-25 — End: 2023-05-17

## 2023-01-25 NOTE — Assessment & Plan Note (Signed)
chronic, stable Sx started in 08/2022 seen by GI & had EGD & colonoscopy & gastric emptying scan, all normal taking Omeprazole 20mg  daily, sometimes bid                       advised again on low acid diet, less caffeine, no smoking sending refill f/u 3-21mos

## 2023-01-26 ENCOUNTER — Encounter: Payer: Self-pay | Admitting: Family

## 2023-01-26 DIAGNOSIS — R102 Pelvic and perineal pain: Secondary | ICD-10-CM

## 2023-01-27 NOTE — Telephone Encounter (Signed)
Patient requests to be called re: status of message for a Referral

## 2023-01-31 NOTE — Telephone Encounter (Signed)
Pt scheduled for 10/10

## 2023-01-31 NOTE — Progress Notes (Deleted)
  Cardiology Office Note:  .   Date:  01/31/2023  ID:  [name], DOB Dec 08, 1995, MRN [id number] PCP: Dulce Sellar, NP  [facility] Providers Cardiologist:  None { Click to update primary MD,subspecialty MD or APP then REFRESH:1}   History of Present Illness: .   Sigmond Patalano is a 27 y.o. male *** Mr. Berrios presented to the emergency room complaining of Lightheadedness he is a Corporate investment banker he denied syncope he has no history of cardiovascular disease his work up was benign, and he was managed with fluids. His symptoms improved after receiving fluids. His EKG showed nonspecific T wave changes in the inferior leads  In August he reported issues with reflux as well as describing chest pain. He scheduled a cardiology appointment for himself with [facility]. He saw cardiologist at [facility] on 9/19 named Dr. Fransisco Beau. He underwent a coronary CT which showed normal coronaries. He has had persistent abnormal T wave inversions in the inferior leads. He's had an echocardiogram August 29th 2024 that showed normal cardiac function. His chest pain was deemed non cardiac. He's had an EGD and has was told he has mild gastritis. Today,   ROS: ***  Studies Reviewed: .        *** Risk Assessment/Calculations:   {Does this patient have ATRIAL FIBRILLATION?[phone]} No BP recorded.  {Refresh Note OR Click here to enter BP  :1}***       Physical Exam:   VS:  There were no vitals taken for this visit.   Wt Readings from Last 3 Encounters:  01/25/23 199 lb (90.3 kg)  01/18/23 212 lb 6.4 oz (96.3 kg)  01/12/23 212 lb 6.4 oz (96.3 kg)    GEN: Well nourished, well developed in no acute distress NECK: No JVD; No carotid bruits CARDIAC: ***RRR, no murmurs, rubs, gallops RESPIRATORY:  Clear to auscultation without rales, wheezing or rhonchi  ABDOMEN: Soft, non-tender, non-distended EXTREMITIES:  No edema; No deformity   ASSESSMENT AND PLAN: .   ***    {Are you ordering a CV Procedure  (e.g. stress test, cath, DCCV, TEE, etc)?   Press F2        :[id number]}  Dispo: ***  Signed, Maisie Fus, MD

## 2023-02-02 ENCOUNTER — Other Ambulatory Visit (HOSPITAL_COMMUNITY)
Admission: RE | Admit: 2023-02-02 | Discharge: 2023-02-02 | Disposition: A | Discharge status: Home / Self Care | Payer: Medicaid Other | Source: Ambulatory Visit | Attending: Family | Admitting: Family

## 2023-02-02 ENCOUNTER — Encounter: Payer: Self-pay | Admitting: Family

## 2023-02-02 ENCOUNTER — Ambulatory Visit: Payer: Medicaid Other | Attending: Internal Medicine | Admitting: Internal Medicine

## 2023-02-02 ENCOUNTER — Ambulatory Visit (INDEPENDENT_AMBULATORY_CARE_PROVIDER_SITE_OTHER): Payer: Medicaid Other | Admitting: Family

## 2023-02-02 VITALS — BP 121/81 | HR 60 | Temp 98.2°F | Ht 71.0 in | Wt 207.2 lb

## 2023-02-02 DIAGNOSIS — K6289 Other specified diseases of anus and rectum: Secondary | ICD-10-CM

## 2023-02-02 DIAGNOSIS — R102 Pelvic and perineal pain: Secondary | ICD-10-CM | POA: Diagnosis not present

## 2023-02-02 LAB — POCT URINALYSIS DIPSTICK
Bilirubin, UA: NEGATIVE
Blood, UA: NEGATIVE
Glucose, UA: NEGATIVE
Ketones, UA: NEGATIVE
Leukocytes, UA: NEGATIVE
Nitrite, UA: NEGATIVE
Protein, UA: NEGATIVE
Spec Grav, UA: 1.015 (ref 1.010–1.025)
Urobilinogen, UA: 0.2 U/dL
pH, UA: 7 (ref 5.0–8.0)

## 2023-02-02 LAB — CBC WITH DIFFERENTIAL/PLATELET
Basophils Absolute: 0.1 10*3/uL (ref 0.0–0.1)
Basophils Relative: 1.1 % (ref 0.0–3.0)
Eosinophils Absolute: 0.3 10*3/uL (ref 0.0–0.7)
Eosinophils Relative: 4.2 % (ref 0.0–5.0)
HCT: 44 % (ref 39.0–52.0)
Hemoglobin: 14.3 g/dL (ref 13.0–17.0)
Lymphocytes Relative: 28.9 % (ref 12.0–46.0)
Lymphs Abs: 1.8 10*3/uL (ref 0.7–4.0)
MCHC: 32.4 g/dL (ref 30.0–36.0)
MCV: 82.8 fL (ref 78.0–100.0)
Monocytes Absolute: 0.4 10*3/uL (ref 0.1–1.0)
Monocytes Relative: 7.1 % (ref 3.0–12.0)
Neutro Abs: 3.7 10*3/uL (ref 1.4–7.7)
Neutrophils Relative %: 58.7 % (ref 43.0–77.0)
Platelets: 283 10*3/uL (ref 150.0–400.0)
RBC: 5.32 Mil/uL (ref 4.22–5.81)
RDW: 13.6 % (ref 11.5–15.5)
WBC: 6.3 10*3/uL (ref 4.0–10.5)

## 2023-02-02 NOTE — Progress Notes (Signed)
Patient ID: Todd Barajas, male    DOB: 08/25/1995, 27 y.o.   MRN: [id number]  Chief Complaint  Patient presents with   Pelvic Pain    Pt c/o pelvic pain and rectal pressure for a week and a half.    *Discussed the use of AI scribe software for clinical note transcription with the patient, who gave verbal consent to proceed.  History of Present Illness   The patient presents with intermittent rectal pain and urinary symptoms, including a burning sensation during urination and a feeling of pressure in the bladder and penis. The rectal pain is described as a pressure sensation, rated 3-4 out of 10, which has improved from a previous rating of 6-7 out of 10. The patient also reports a history of tonsil stones and a recent course of Augmentin. He denies any visible irritation or inflammation in the genital area. The patient has been sexually active and has undergone testing for sexually transmitted diseases (STDs) in the past, with negative results. He expresses concern about the possibility of prostatitis, but notes that he has been on antibiotics recently.      Assessment & Plan:     Pelvic pain - Intermittent rectal and penile pain with some urinary burning. No urinary symptoms such as hematuria or foul odor. No skin irritation or inflammation. No history of kidney stones. -Order urine test for STDs (Chlamydia, Gonorrhea, Trichomonas). -Order blood count to check for elevated white blood cell count indicating possible infection. -If tests are negative, refer to Urology for further evaluation.  Rectal pain -  Possible prostatitis based on symptoms. Regular bowel movements, no issues reported. -If blood count shows elevated blood count & neutrophils, treat for prostatitis.      Subjective:    Outpatient Medications Prior to Visit  Medication Sig Dispense Refill   amoxicillin-clavulanate (AUGMENTIN) 875-125 MG tablet Take 1 tablet by mouth every 12 (twelve) hours. 14 tablet 0    omeprazole (PRILOSEC) 20 MG capsule Take 1 capsule (20 mg total) by mouth daily. Ok to increase to 1 pill bid prn, 2nd dose at bedtime to prevent am sx. 60 capsule 5   triamcinolone (NASACORT) 55 MCG/ACT AERO nasal inhaler Place 1 spray into the nose daily. Start with 1 spray each side twice a day for 3 days, then reduce to daily. 1 each 2   No facility-administered medications prior to visit.   Past Medical History:  Diagnosis Date   Asthma    No past surgical history on file. No Known Allergies    Objective:    Physical Exam Vitals and nursing note reviewed.  Constitutional:      General: He is not in acute distress.    Appearance: Normal appearance.  HENT:     Head: Normocephalic.  Cardiovascular:     Rate and Rhythm: Normal rate and regular rhythm.  Pulmonary:     Effort: Pulmonary effort is normal.     Breath sounds: Normal breath sounds.  Musculoskeletal:        General: Normal range of motion.     Cervical back: Normal range of motion.  Skin:    General: Skin is warm and dry.  Neurological:     Mental Status: He is alert and oriented to person, place, and time.  Psychiatric:        Mood and Affect: Mood normal.    BP 121/81 (BP Location: Left Arm, Patient Position: Sitting, Cuff Size: Large)   Pulse 60   Temp 98.2 F (  36.8 C) (Temporal)   Ht 5\' 11"  (1.803 m)   Wt 207 lb 3.2 oz (94 kg)   SpO2 98%   BMI 28.90 kg/m  Wt Readings from Last 3 Encounters:  02/02/23 207 lb 3.2 oz (94 kg)  01/25/23 199 lb (90.3 kg)  01/18/23 212 lb 6.4 oz (96.3 kg)       Dulce Sellar, NP

## 2023-02-06 LAB — URINE CYTOLOGY ANCILLARY ONLY
Chlamydia: NEGATIVE
Comment: NEGATIVE
Comment: NEGATIVE
Comment: NORMAL
Neisseria Gonorrhea: NEGATIVE
Trichomonas: NEGATIVE

## 2023-02-07 NOTE — Telephone Encounter (Signed)
Let Todd Barajas know I sent him a lab result msg asking if he was still having pain, but he responded on a different string?  so ok to send referral to Urology with pelvic pain DX and let him know please.

## 2023-02-08 ENCOUNTER — Ambulatory Visit: Payer: Medicaid Other | Admitting: Gastroenterology

## 2023-02-08 ENCOUNTER — Telehealth: Payer: Self-pay | Admitting: Family

## 2023-02-08 NOTE — Telephone Encounter (Signed)
Pt would like a call back concerning the referral and his next appt at this office. Please advise.

## 2023-02-09 NOTE — Telephone Encounter (Signed)
MyChart message sent to pt

## 2023-02-10 ENCOUNTER — Ambulatory Visit: Payer: Medicaid Other | Admitting: Family

## 2023-02-10 ENCOUNTER — Telehealth: Payer: Self-pay

## 2023-02-10 NOTE — Telephone Encounter (Signed)
I called and spoke with pt to see if [facility] has contacted him yet. Pt states he spoke with them this morning and he is scheduled for 10/23. Judeth Cornfield advised pt if any new sx he should keep his appointment for today at 3:00 pm, Pt states he does need appointment scheduled for today and would like to cancel.

## 2023-02-10 NOTE — Progress Notes (Deleted)
   Patient ID: Todd Barajas, male    DOB: July 29, 1995, 27 y.o.   MRN: [id number]  No chief complaint on file.    Assessment & Plan:     Subjective:    Outpatient Medications Prior to Visit  Medication Sig Dispense Refill   amoxicillin-clavulanate (AUGMENTIN) 875-125 MG tablet Take 1 tablet by mouth every 12 (twelve) hours. 14 tablet 0   omeprazole (PRILOSEC) 20 MG capsule Take 1 capsule (20 mg total) by mouth daily. Ok to increase to 1 pill bid prn, 2nd dose at bedtime to prevent am sx. 60 capsule 5   triamcinolone (NASACORT) 55 MCG/ACT AERO nasal inhaler Place 1 spray into the nose daily. Start with 1 spray each side twice a day for 3 days, then reduce to daily. 1 each 2   No facility-administered medications prior to visit.   Past Medical History:  Diagnosis Date   Asthma    No past surgical history on file. No Known Allergies    Objective:    Physical Exam Vitals and nursing note reviewed.  Constitutional:      General: He is not in acute distress.    Appearance: Normal appearance.  HENT:     Head: Normocephalic.  Cardiovascular:     Rate and Rhythm: Normal rate and regular rhythm.  Pulmonary:     Effort: Pulmonary effort is normal.     Breath sounds: Normal breath sounds.  Musculoskeletal:        General: Normal range of motion.     Cervical back: Normal range of motion.  Skin:    General: Skin is warm and dry.  Neurological:     Mental Status: He is alert and oriented to person, place, and time.  Psychiatric:        Mood and Affect: Mood normal.    There were no vitals taken for this visit. Wt Readings from Last 3 Encounters:  02/02/23 207 lb 3.2 oz (94 kg)  01/25/23 199 lb (90.3 kg)  01/18/23 212 lb 6.4 oz (96.3 kg)       Dulce Sellar, NP

## 2023-02-13 ENCOUNTER — Telehealth: Payer: Self-pay | Admitting: Family

## 2023-02-13 ENCOUNTER — Encounter: Payer: Self-pay | Admitting: Family

## 2023-02-13 DIAGNOSIS — R102 Pelvic and perineal pain: Secondary | ICD-10-CM

## 2023-02-13 DIAGNOSIS — K6289 Other specified diseases of anus and rectum: Secondary | ICD-10-CM

## 2023-02-13 NOTE — Telephone Encounter (Signed)
Patient states he was informed by [facility] that they don't take his insurance. He was informed by insurance that the following places are in network. Pt wants to see who he can get in with the fastest.    [facility]  [phone] [address]. [address], [place] [address]  Phone: [phone]  Fax: [phone]   2.  [facility]   [address], [address], [place], [place] [address]  Phone:  [phone]  3. [facility]   [address], [place], [place] [address]  Phone: [phone]  Fax: [phone]

## 2023-02-14 NOTE — Telephone Encounter (Signed)
Sent patient a my chart message with information below

## 2023-02-16 ENCOUNTER — Encounter (INDEPENDENT_AMBULATORY_CARE_PROVIDER_SITE_OTHER): Payer: Self-pay | Admitting: Otolaryngology

## 2023-02-24 DIAGNOSIS — Z419 Encounter for procedure for purposes other than remedying health state, unspecified: Secondary | ICD-10-CM | POA: Diagnosis not present

## 2023-03-15 ENCOUNTER — Encounter: Payer: Self-pay | Admitting: Urology

## 2023-03-15 ENCOUNTER — Ambulatory Visit: Payer: Medicaid Other | Admitting: Urology

## 2023-03-15 VITALS — BP 114/78 | HR 64 | Ht 71.0 in | Wt 207.0 lb

## 2023-03-15 DIAGNOSIS — R102 Pelvic and perineal pain: Secondary | ICD-10-CM | POA: Diagnosis not present

## 2023-03-15 DIAGNOSIS — N418 Other inflammatory diseases of prostate: Secondary | ICD-10-CM

## 2023-03-15 DIAGNOSIS — R309 Painful micturition, unspecified: Secondary | ICD-10-CM

## 2023-03-15 LAB — URINALYSIS, COMPLETE
Bilirubin, UA: NEGATIVE
Glucose, UA: NEGATIVE
Ketones, UA: NEGATIVE
Leukocytes,UA: NEGATIVE
Nitrite, UA: NEGATIVE
Protein,UA: NEGATIVE
RBC, UA: NEGATIVE
Specific Gravity, UA: 1.02 (ref 1.005–1.030)
Urobilinogen, Ur: 0.2 mg/dL (ref 0.2–1.0)
pH, UA: 7 (ref 5.0–7.5)

## 2023-03-15 LAB — MICROSCOPIC EXAMINATION
Bacteria, UA: NONE SEEN
RBC, Urine: NONE SEEN /[HPF] (ref 0–2)

## 2023-03-15 MED ORDER — ALFUZOSIN HCL ER 10 MG PO TB24: 10.0000 mg | ORAL_TABLET | Freq: Every day | ORAL | 0 refills | Status: DC

## 2023-03-15 NOTE — Progress Notes (Signed)
I, Todd Barajas, acting as a scribe for Todd Altes, MD., have documented all relevant documentation on the behalf of Todd Altes, MD, as directed by Todd Altes, MD while in the presence of Todd Altes, MD.  03/15/2023 1:39 PM   Todd Barajas 11-Jun-1995 [id number]  Referring provider: Dulce Sellar, NP [address] [place],  [place] [address]  Chief Complaint  Patient presents with   New Patient (Initial Visit)    HPI: Todd Barajas is a 27 y.o. male referred for evaluation of pelvic pain.  Presents with a 2 month history of pelvic pain. He notes intermittent, sharp, suprapubic pain with some radiation to the urethra. This pain is intermittent. He also has fairly constant rectal pressure, which is mild.  PCP visit 02/02/2023 with negative urinalysis. He has mild lower urinary tract symptoms consisting of urinary urgency and frequency.  IPSS today 4/35.  Denies gross hematuria.  No previous history of urologic problems. Negative STI testing September 2024.  Underwent GI evaluation September 2024 with endoscopy and colonoscopy and was found to have two polyps.   PMH: Past Medical History:  Diagnosis Date   Asthma     Surgical History: History reviewed. No pertinent surgical history.  Home Medications:  Allergies as of 03/15/2023   No Known Allergies      Medication List        Accurate as of March 15, 2023  1:39 PM. If you have any questions, ask your nurse or doctor.          alfuzosin 10 MG 24 hr tablet Commonly known as: UROXATRAL Take 1 tablet (10 mg total) by mouth daily with breakfast. Started by: Todd Barajas   amoxicillin-clavulanate 875-125 MG tablet Commonly known as: AUGMENTIN Take 1 tablet by mouth every 12 (twelve) hours.   omeprazole 20 MG capsule Commonly known as: PRILOSEC Take 1 capsule (20 mg total) by mouth daily. Ok to increase to 1 pill bid prn, 2nd dose at bedtime to prevent am sx.    triamcinolone 55 MCG/ACT Aero nasal inhaler Commonly known as: NASACORT Place 1 spray into the nose daily. Start with 1 spray each side twice a day for 3 days, then reduce to daily.        Allergies: No Known Allergies  Family History: Family History  Problem Relation Age of Onset   Colon cancer Mother    Asthma Father    Hypertension Father    Stroke Father    Heart disease Father     Social History:  reports that he quit smoking about 4 years ago. His smoking use included cigars and cigarettes. He started smoking about 10 years ago. He has a 1.3 pack-year smoking history. He has never used smokeless tobacco. He reports that he does not currently use drugs after having used the following drugs: Marijuana. He reports that he does not drink alcohol.   Physical Exam: BP 114/78   Pulse 64   Ht 5\' 11"  (1.803 m)   Wt 207 lb (93.9 kg)   BMI 28.87 kg/m   Constitutional:  Alert, No acute distress. HEENT: [place] AT Respiratory: Normal respiratory effort, no increased work of breathing. GU: Phallus without lesions. Testes descended bilaterally without masses or tenderness.  Psychiatric: Normal mood and affect.   Urinalysis Microscopy negative   Assessment & Plan:    1. Pelvic pain We discussed approximately 90% of prostatitis is inflammatory and treated based on symptoms and not laboratory abnormalities.  Trial alfuzosin 10 mg daily.  We also discussed possibility of pelvic floor abnormalities, accounting for his symptoms.  PA follow-up 1 month for symptom recheck and if no significant improvement, referral for a pelvic floor PT.   I have reviewed the above documentation for accuracy and completeness, and I agree with the above.   Todd Altes, MD  [facility] [address] [place], [place] [address] [phone]

## 2023-03-26 DIAGNOSIS — Z419 Encounter for procedure for purposes other than remedying health state, unspecified: Secondary | ICD-10-CM | POA: Diagnosis not present

## 2023-04-13 ENCOUNTER — Ambulatory Visit: Payer: Medicaid Other | Admitting: Physician Assistant

## 2023-04-14 ENCOUNTER — Ambulatory Visit (INDEPENDENT_AMBULATORY_CARE_PROVIDER_SITE_OTHER): Payer: Medicaid Other | Admitting: Physician Assistant

## 2023-04-14 VITALS — BP 123/81 | HR 64

## 2023-04-14 DIAGNOSIS — R102 Pelvic and perineal pain: Secondary | ICD-10-CM | POA: Diagnosis not present

## 2023-04-14 MED ORDER — ALFUZOSIN HCL ER 10 MG PO TB24
10.0000 mg | ORAL_TABLET | Freq: Every day | ORAL | 11 refills | Status: DC
Start: 2023-04-14 — End: 2023-05-17

## 2023-04-14 NOTE — Patient Instructions (Signed)
 Pelvic floor PT scheduling: [phone]

## 2023-04-14 NOTE — Progress Notes (Signed)
   04/14/2023 3:30 PM   Todd Barajas [birth year] [id number]  CC: Chief Complaint  Patient presents with   Follow-up   HPI: Todd Barajas is a 27 y.o. male with PMH chronic pelvic pain who presents today for symptom recheck on alfuzosin.   Today he reports he thinks the alfuzosin may have helped a little bit, but he continues to have intermittent pelvic pain/pressure/tightness.  He thinks that it is likely musculoskeletal and related to his pelvic floor given involvement of his rectum and his discomfort.  He is scheduled for pelvic MRI tomorrow per GI.  PMH: Past Medical History:  Diagnosis Date   Asthma     Surgical History: No past surgical history on file.  Home Medications:  Allergies as of 04/14/2023   No Known Allergies      Medication List        Accurate as of April 14, 2023  3:30 PM. If you have any questions, ask your nurse or doctor.          alfuzosin 10 MG 24 hr tablet Commonly known as: UROXATRAL Take 1 tablet (10 mg total) by mouth daily with breakfast.   amoxicillin-clavulanate 875-125 MG tablet Commonly known as: AUGMENTIN Take 1 tablet by mouth every 12 (twelve) hours.   omeprazole 20 MG capsule Commonly known as: PRILOSEC Take 1 capsule (20 mg total) by mouth daily. Ok to increase to 1 pill bid prn, 2nd dose at bedtime to prevent am sx.   triamcinolone 55 MCG/ACT Aero nasal inhaler Commonly known as: NASACORT Place 1 spray into the nose daily. Start with 1 spray each side twice a day for 3 days, then reduce to daily.        Allergies:  No Known Allergies  Family History: Family History  Problem Relation Age of Onset   Colon cancer Mother    Asthma Father    Hypertension Father    Stroke Father    Heart disease Father     Social History:   reports that he quit smoking about 4 years ago. His smoking use included cigars and cigarettes. He started smoking about 10 years ago. He has a 1.3 pack-year smoking history. He has  never used smokeless tobacco. He reports that he does not currently use drugs after having used the following drugs: Marijuana. He reports that he does not drink alcohol.  Physical Exam: BP 123/81   Pulse 64   Constitutional:  Alert and oriented, no acute distress, nontoxic appearing HEENT: [place], AT Cardiovascular: No clubbing, cyanosis, or edema Respiratory: Normal respiratory effort, no increased work of breathing Skin: No rashes, bruises or suspicious lesions Neurologic: Grossly intact, no focal deficits, moving all 4 extremities Psychiatric: Normal mood and affect  Assessment & Plan:   1. Pelvic pain in male (Primary) Minimal improvement on alfuzosin.  Pelvic floor PT and he is in agreement.  I gave him the number for scheduling today and I am placing referral.  Agree with MRI per GI. - Ambulatory referral to Physical Therapy - alfuzosin (UROXATRAL) 10 MG 24 hr tablet; Take 1 tablet (10 mg total) by mouth daily with breakfast.  Dispense: 30 tablet; Refill: 11   Return if symptoms worsen or fail to improve.  Carman Ching, PA-C  [facility] [place] [address], [address] [place], [place] [address] [phone]

## 2023-04-26 DIAGNOSIS — Z419 Encounter for procedure for purposes other than remedying health state, unspecified: Secondary | ICD-10-CM | POA: Diagnosis not present

## 2023-04-27 DIAGNOSIS — R198 Other specified symptoms and signs involving the digestive system and abdomen: Secondary | ICD-10-CM | POA: Diagnosis not present

## 2023-04-27 DIAGNOSIS — K5902 Outlet dysfunction constipation: Secondary | ICD-10-CM | POA: Diagnosis not present

## 2023-05-15 ENCOUNTER — Ambulatory Visit: Payer: Medicaid Other | Admitting: Family

## 2023-05-15 NOTE — Progress Notes (Deleted)
   Patient ID: Todd Barajas, male    DOB: [birth year], 28 y.o.   MRN: [id number]  No chief complaint on file.           Assessment & Plan:   Subjective:    Outpatient Medications Prior to Visit  Medication Sig Dispense Refill   alfuzosin (UROXATRAL) 10 MG 24 hr tablet Take 1 tablet (10 mg total) by mouth daily with breakfast. 30 tablet 11   amoxicillin-clavulanate (AUGMENTIN) 875-125 MG tablet Take 1 tablet by mouth every 12 (twelve) hours. 14 tablet 0   omeprazole (PRILOSEC) 20 MG capsule Take 1 capsule (20 mg total) by mouth daily. Ok to increase to 1 pill bid prn, 2nd dose at bedtime to prevent am sx. 60 capsule 5   triamcinolone (NASACORT) 55 MCG/ACT AERO nasal inhaler Place 1 spray into the nose daily. Start with 1 spray each side twice a day for 3 days, then reduce to daily. 1 each 2   No facility-administered medications prior to visit.   Past Medical History:  Diagnosis Date   Asthma    No past surgical history on file. No Known Allergies    Objective:    Physical Exam Vitals and nursing note reviewed.  Constitutional:      General: He is not in acute distress.    Appearance: Normal appearance.  HENT:     Head: Normocephalic.     Right Ear: Tympanic membrane and ear canal normal.     Left Ear: Tympanic membrane and ear canal normal.     Nose:     Right Sinus: Frontal sinus tenderness present. No maxillary sinus tenderness.     Left Sinus: Frontal sinus tenderness present. No maxillary sinus tenderness.     Mouth/Throat:     Mouth: Mucous membranes are moist.     Pharynx: No pharyngeal swelling, oropharyngeal exudate, posterior oropharyngeal erythema or uvula swelling.     Tonsils: No tonsillar exudate or tonsillar abscesses.  Cardiovascular:     Rate and Rhythm: Normal rate and regular rhythm.  Pulmonary:     Effort: Pulmonary effort is normal.     Breath sounds: Normal breath sounds.  Musculoskeletal:        General: Normal range of motion.     Cervical  back: Normal range of motion.  Lymphadenopathy:     Head:     Right side of head: No preauricular or posterior auricular adenopathy.     Left side of head: No preauricular or posterior auricular adenopathy.     Cervical: No cervical adenopathy.  Skin:    General: Skin is warm and dry.  Neurological:     Mental Status: He is alert and oriented to person, place, and time.  Psychiatric:        Mood and Affect: Mood normal.    There were no vitals taken for this visit. Wt Readings from Last 3 Encounters:  03/15/23 207 lb (93.9 kg)  02/02/23 207 lb 3.2 oz (94 kg)  01/25/23 199 lb (90.3 kg)       Dulce Sellar, NP

## 2023-05-17 ENCOUNTER — Encounter: Payer: Self-pay | Admitting: Family

## 2023-05-17 ENCOUNTER — Ambulatory Visit: Payer: Medicaid Other | Admitting: Family

## 2023-05-17 VITALS — BP 126/87 | HR 73 | Temp 98.2°F | Ht 71.0 in | Wt 225.2 lb

## 2023-05-17 DIAGNOSIS — J329 Chronic sinusitis, unspecified: Secondary | ICD-10-CM

## 2023-05-17 DIAGNOSIS — B9789 Other viral agents as the cause of diseases classified elsewhere: Secondary | ICD-10-CM | POA: Diagnosis not present

## 2023-05-17 NOTE — Progress Notes (Signed)
Patient ID: Todd Barajas, male    DOB: [birth year], 29 y.o.   MRN: [id number]  Chief Complaint  Patient presents with   Sinus Problem    Pt c/o runny nose and cough with yellow mucus. Present since last Saturday. Covid negative Monday. Has tried Dayquil and mucinex. Pt does not want testing.        Discussed the use of AI scribe software for clinical note transcription with the patient, who gave verbal consent to proceed.  History of Present Illness   The patient, with a history of sinus issues and a recent cold, presents with persistent symptoms of a runny nose and excessive sneezing. He reports that these symptoms have been ongoing for about a week and have been exacerbated by cold weather and his work environment, which involves being outdoors and in close proximity to others who are also coughing and sneezing. He denies fever but admits to experiencing chills. He has not been using Nasacort, which was previously prescribed, but it is currently at his home. He reports feeling better today, with this being the best day he's had in a while.      Assessment & Plan:     Viral Sinusitis - Persistent runny nose and sneezing for the past week. No fever or chills. Possible viral infection or allergy exacerbation. -Start Nasacort as previously prescribed, twice daily for a few days then daily until symptoms are improved. Ok to continue every other day or every few days to control symptoms. -Hydrate with at least 2L of water daily. -Can use saline nasal spray, 3 Times per day to help disinfect sinuses. -RTO precautions provided.     Subjective:    Outpatient Medications Prior to Visit  Medication Sig Dispense Refill   alfuzosin (UROXATRAL) 10 MG 24 hr tablet Take 1 tablet (10 mg total) by mouth daily with breakfast. (Patient not taking: Reported on 05/17/2023) 30 tablet 11   amoxicillin-clavulanate (AUGMENTIN) 875-125 MG tablet Take 1 tablet by mouth every 12 (twelve) hours. (Patient not taking:  Reported on 05/17/2023) 14 tablet 0   omeprazole (PRILOSEC) 20 MG capsule Take 1 capsule (20 mg total) by mouth daily. Ok to increase to 1 pill bid prn, 2nd dose at bedtime to prevent am sx. (Patient not taking: Reported on 05/17/2023) 60 capsule 5   triamcinolone (NASACORT) 55 MCG/ACT AERO nasal inhaler Place 1 spray into the nose daily. Start with 1 spray each side twice a day for 3 days, then reduce to daily. (Patient not taking: Reported on 05/17/2023) 1 each 2   No facility-administered medications prior to visit.   Past Medical History:  Diagnosis Date   Asthma    No past surgical history on file. No Known Allergies    Objective:    Physical Exam Vitals and nursing note reviewed.  Constitutional:      General: He is not in acute distress.    Appearance: Normal appearance.  HENT:     Head: Normocephalic.     Right Ear: Tympanic membrane and ear canal normal.     Left Ear: Tympanic membrane and ear canal normal.     Nose:     Right Sinus: No maxillary sinus tenderness or frontal sinus tenderness.     Left Sinus: No maxillary sinus tenderness or frontal sinus tenderness.     Mouth/Throat:     Mouth: Mucous membranes are moist.     Pharynx: No pharyngeal swelling, oropharyngeal exudate, posterior oropharyngeal erythema or uvula swelling.  Tonsils: No tonsillar exudate or tonsillar abscesses.  Cardiovascular:     Rate and Rhythm: Normal rate and regular rhythm.  Pulmonary:     Effort: Pulmonary effort is normal.     Breath sounds: Normal breath sounds.  Musculoskeletal:        General: Normal range of motion.     Cervical back: Normal range of motion.  Lymphadenopathy:     Head:     Right side of head: No preauricular or posterior auricular adenopathy.     Left side of head: No preauricular or posterior auricular adenopathy.     Cervical: No cervical adenopathy.  Skin:    General: Skin is warm and dry.  Neurological:     Mental Status: He is alert and oriented to  person, place, and time.  Psychiatric:        Mood and Affect: Mood normal.   BP 126/87 (BP Location: Left Arm, Patient Position: Sitting)   Pulse 73   Temp 98.2 F (36.8 C) (Temporal)   Ht 5\' 11"  (1.803 m)   Wt 225 lb 3.2 oz (102.2 kg)   SpO2 98%   BMI 31.41 kg/m  Wt Readings from Last 3 Encounters:  05/17/23 225 lb 3.2 oz (102.2 kg)  03/15/23 207 lb (93.9 kg)  02/02/23 207 lb 3.2 oz (94 kg)       Dulce Sellar, NP

## 2023-05-27 DIAGNOSIS — Z419 Encounter for procedure for purposes other than remedying health state, unspecified: Secondary | ICD-10-CM | POA: Diagnosis not present

## 2023-06-24 DIAGNOSIS — Z419 Encounter for procedure for purposes other than remedying health state, unspecified: Secondary | ICD-10-CM | POA: Diagnosis not present

## 2023-06-29 ENCOUNTER — Ambulatory Visit: Payer: Medicaid Other | Admitting: Family

## 2023-07-03 ENCOUNTER — Encounter: Payer: Self-pay | Admitting: Family

## 2023-07-03 ENCOUNTER — Ambulatory Visit (INDEPENDENT_AMBULATORY_CARE_PROVIDER_SITE_OTHER): Admitting: Family

## 2023-07-03 VITALS — BP 137/91 | HR 84 | Temp 97.1°F | Ht 71.0 in | Wt 233.4 lb

## 2023-07-03 DIAGNOSIS — Z789 Other specified health status: Secondary | ICD-10-CM

## 2023-07-03 DIAGNOSIS — M7918 Myalgia, other site: Secondary | ICD-10-CM

## 2023-07-03 NOTE — Progress Notes (Signed)
 Patient ID: Todd Barajas, male    DOB: 1995/10/19, 28 y.o.   MRN: [id number]  Chief Complaint  Patient presents with   Abdominal Pain    Pt c/o left sided abdominal since Saturday after drinking.          Discussed the use of AI scribe software for clinical note transcription with the patient, who gave verbal consent to proceed.  History of Present Illness   The patient presents with left-sided rib/side pain that began after a night of heavy drinking. He describes the pain as a 'tightness' that has been present for three days, but is gradually improving. The pain is located on his side, just under the ribs, and is not exacerbated by pressure. He denies blacking out or having Nausea/vomiting after his drinking. He denies any known trauma or unusual physical activity that could have caused the pain. The patient reports no difficulty breathing, but does feel a pulling sensation when turning his side. He denies any vomiting or other symptoms that could suggest kidney damage.     Assessment & Plan:     Muscle strain - Pain and tightness under left ribcage likely due to muscle strain from physical activity during alcohol consumption or through his work. No rib pain with palpation and reassured pt unlikely any organ damage after one night of drinking. - Apply heat to affected area up to 3x/day for up to 30 minutes. - Use analgesic creams like Icy Hot, Biofreeze, etc tid prn. - Take ibuprofen 600mg  tid prn or 2 Aleve bid for anti-inflammatory effects for 2-3 days.  Alcohol consumption - Heavy alcohol consumption on Friday night. No signs of liver damage. Emphasized moderation to prevent health issues. -Drink at least 2L water every day and when consuming alcohol. - Advised on moderation in alcohol consumption.      Subjective:    Outpatient Medications Prior to Visit  Medication Sig Dispense Refill   triamcinolone (NASACORT) 55 MCG/ACT AERO nasal inhaler Place 1 spray into the nose daily.  Start with 1 spray each side twice a day for 3 days, then reduce to daily. (Patient not taking: Reported on 07/03/2023) 1 each 2   No facility-administered medications prior to visit.   Past Medical History:  Diagnosis Date   Asthma    No past surgical history on file. No Known Allergies    Objective:    Physical Exam Vitals and nursing note reviewed.  Constitutional:      General: He is not in acute distress.    Appearance: Normal appearance.  HENT:     Head: Normocephalic.  Cardiovascular:     Rate and Rhythm: Normal rate and regular rhythm.  Pulmonary:     Effort: Pulmonary effort is normal.     Breath sounds: Normal breath sounds.  Musculoskeletal:        General: Normal range of motion.     Cervical back: Normal range of motion.     Thoracic back: No swelling, signs of trauma, tenderness or bony tenderness. Normal range of motion.     Lumbar back: No swelling, edema, signs of trauma, tenderness or bony tenderness. Normal range of motion.  Skin:    General: Skin is warm and dry.  Neurological:     Mental Status: He is alert and oriented to person, place, and time.  Psychiatric:        Mood and Affect: Mood normal.    BP (!) 137/91 (BP Location: Left Arm, Patient Position: Sitting, Cuff Size:  Large)   Pulse 84   Temp (!) 97.1 F (36.2 C) (Temporal)   Ht 5\' 11"  (1.803 m)   Wt 233 lb 6.4 oz (105.9 kg)   SpO2 96%   BMI 32.55 kg/m  Wt Readings from Last 3 Encounters:  07/03/23 233 lb 6.4 oz (105.9 kg)  05/17/23 225 lb 3.2 oz (102.2 kg)  03/15/23 207 lb (93.9 kg)      Dulce Sellar, NP

## 2023-07-05 DIAGNOSIS — S90111A Contusion of right great toe without damage to nail, initial encounter: Secondary | ICD-10-CM | POA: Diagnosis not present

## 2023-07-05 DIAGNOSIS — M7989 Other specified soft tissue disorders: Secondary | ICD-10-CM | POA: Diagnosis not present

## 2023-07-05 DIAGNOSIS — Z87891 Personal history of nicotine dependence: Secondary | ICD-10-CM | POA: Diagnosis not present

## 2023-08-05 DIAGNOSIS — Z419 Encounter for procedure for purposes other than remedying health state, unspecified: Secondary | ICD-10-CM | POA: Diagnosis not present

## 2023-09-04 DIAGNOSIS — Z419 Encounter for procedure for purposes other than remedying health state, unspecified: Secondary | ICD-10-CM | POA: Diagnosis not present

## 2023-09-11 ENCOUNTER — Ambulatory Visit: Admitting: Family

## 2023-09-12 ENCOUNTER — Ambulatory Visit: Admitting: Family

## 2023-09-13 ENCOUNTER — Ambulatory Visit: Admitting: Family

## 2023-10-05 DIAGNOSIS — Z419 Encounter for procedure for purposes other than remedying health state, unspecified: Secondary | ICD-10-CM | POA: Diagnosis not present

## 2023-10-11 DIAGNOSIS — S0501XA Injury of conjunctiva and corneal abrasion without foreign body, right eye, initial encounter: Secondary | ICD-10-CM | POA: Diagnosis not present

## 2023-10-11 DIAGNOSIS — S0502XA Injury of conjunctiva and corneal abrasion without foreign body, left eye, initial encounter: Secondary | ICD-10-CM | POA: Diagnosis not present

## 2023-10-11 DIAGNOSIS — R52 Pain, unspecified: Secondary | ICD-10-CM | POA: Diagnosis not present

## 2023-10-11 DIAGNOSIS — W458XXA Other foreign body or object entering through skin, initial encounter: Secondary | ICD-10-CM | POA: Diagnosis not present

## 2023-10-11 DIAGNOSIS — Z87891 Personal history of nicotine dependence: Secondary | ICD-10-CM | POA: Diagnosis not present

## 2023-10-16 DIAGNOSIS — R0789 Other chest pain: Secondary | ICD-10-CM | POA: Diagnosis not present

## 2023-10-16 DIAGNOSIS — J982 Interstitial emphysema: Secondary | ICD-10-CM | POA: Diagnosis not present

## 2023-10-30 ENCOUNTER — Ambulatory Visit: Payer: Self-pay

## 2023-10-30 DIAGNOSIS — R0789 Other chest pain: Secondary | ICD-10-CM | POA: Diagnosis not present

## 2023-10-30 DIAGNOSIS — Z87891 Personal history of nicotine dependence: Secondary | ICD-10-CM | POA: Diagnosis not present

## 2023-10-30 DIAGNOSIS — R079 Chest pain, unspecified: Secondary | ICD-10-CM | POA: Diagnosis not present

## 2023-10-30 DIAGNOSIS — Z79899 Other long term (current) drug therapy: Secondary | ICD-10-CM | POA: Diagnosis not present

## 2023-10-30 DIAGNOSIS — R7989 Other specified abnormal findings of blood chemistry: Secondary | ICD-10-CM | POA: Diagnosis not present

## 2023-10-30 DIAGNOSIS — R0602 Shortness of breath: Secondary | ICD-10-CM | POA: Diagnosis not present

## 2023-10-30 NOTE — Telephone Encounter (Signed)
 noted

## 2023-10-30 NOTE — Telephone Encounter (Signed)
 FYI Only or Action Required?: FYI only for provider.  Patient was last seen in primary care on 07/03/2023 by Lucius Krabbe, NP. Called Nurse Triage reporting Chest Pain. Symptoms began several weeks ago. Interventions attempted: Nothing. Symptoms are: unchanged.  Triage Disposition: Go to ED or PCP/Alternative with Approval  Patient/caregiver understands and will follow disposition?: Patient states he is going to ED- he will call office for hospital follow up appointment  Reason for Disposition . [1] Chest pain lasts > 5 minutes AND [2] occurred in past 3 days (72 hours) (Exception: Feels exactly the same as previously diagnosed heartburn and has accompanying sour taste in mouth.)  Answer Assessment - Initial Assessment Questions 1. LOCATION: Where does it hurt?       Left side at heart 2. RADIATION: Does the pain go anywhere else? (e.g., into neck, jaw, arms, back)     Same all the time 3. ONSET: When did the chest pain begin? (Minutes, hours or days)      2 weeks ago 4. PATTERN: Does the pain come and go, or has it been constant since it started?  Does it get worse with exertion?      constant 5. DURATION: How long does it last (e.g., seconds, minutes, hours)     All the time 6. SEVERITY: How bad is the pain?  (e.g., Scale 1-10; mild, moderate, or severe)    - MILD (1-3): doesn't interfere with normal activities     - MODERATE (4-7): interferes with normal activities or awakens from sleep    - SEVERE (8-10): excruciating pain, unable to do any normal activities       4/10 7. CARDIAC RISK FACTORS: Do you have any history of heart problems or risk factors for heart disease? (e.g., angina, prior heart attack; diabetes, high blood pressure, high cholesterol, smoker, or strong family history of heart disease)     smoking 8. PULMONARY RISK FACTORS: Do you have any history of lung disease?  (e.g., blood clots in lung, asthma, emphysema, birth control pills)      asthma 9. CAUSE: What do you think is causing the chest pain?     Unsure- was seen in [facility] ED- diagnosed with pneumomediastinum 1 1/2 weeks ago- was told it would get better and has not improved 10. OTHER SYMPTOMS: Do you have any other symptoms? (e.g., dizziness, nausea, vomiting, sweating, fever, difficulty breathing, cough)       SOB  Protocols used: Chest Pain-A-AH

## 2023-10-30 NOTE — Telephone Encounter (Signed)
 Patient hung up before agent was able to speak with Nurse Triage. Patient called by this RN-left a message to call back to Nurse triage. Will place in call backs.   Copied from CRM [id number]. Topic: Clinical - Red Word Triage >> Oct 30, 2023  8:33 AM Charlet HERO wrote: Red Word that prompted transfer to Nurse Triage: Patient is calling bc he is having chest pains they have been going on for 2 weeks. On left side

## 2023-10-31 ENCOUNTER — Ambulatory Visit: Payer: Self-pay

## 2023-10-31 NOTE — Telephone Encounter (Signed)
  FYI Only or Action Required?: FYI only for provider.  Patient was last seen in primary care on 07/03/2023 by Lucius Krabbe, NP.  Called Nurse Triage reporting Shortness of Breath and Chest Pain.  Symptoms began several weeks ago.  Interventions attempted: Other: two ED visits.  Symptoms are: unchanged.  Triage Disposition: See PCP When Office is Open (Within 3 Days)-appointment scheduled for 11/03/23  Patient/caregiver understands and will follow disposition?: Yes    Reason for Disposition  [1] Chest pain from known angina comes and goes AND [2] is NOT happening more often (increasing in frequency) or getting worse (increasing in severity)  Answer Assessment - Initial Assessment Questions 1. LOCATION: Where does it hurt?       Left chest, heart area 2. RADIATION: Does the pain go anywhere else? (e.g., into neck, jaw, arms, back)     denies 3. ONSET: When did the chest pain begin? (Minutes, hours or days)      2-3 weeks ago 4. PATTERN: Does the pain come and go, or has it been constant since it started?  Does it get worse with exertion?      Constant, dull tightness 5. DURATION: How long does it last (e.g., seconds, minutes, hours)     constant 6. SEVERITY: How bad is the pain?  (e.g., Scale 1-10; mild, moderate, or severe)    - MILD (1-3): doesn't interfere with normal activities     - MODERATE (4-7): interferes with normal activities or awakens from sleep    - SEVERE (8-10): excruciating pain, unable to do any normal activities       3-4/10 7. CARDIAC RISK FACTORS: Do you have any history of heart problems or risk factors for heart disease? (e.g., angina, prior heart attack; diabetes, high blood pressure, high cholesterol, smoker, or strong family history of heart disease)     Has seen cardiologist in the past and has been to ED once on 10/31/2023 and 10/16/2023 8. PULMONARY RISK FACTORS: Do you have any history of lung disease?  (e.g., blood clots in lung,  asthma, emphysema, birth control pills)     denies 9. CAUSE: What do you think is causing the chest pain?     unsure 10. OTHER SYMPTOMS: Do you have any other symptoms? (e.g., dizziness, nausea, vomiting, sweating, fever, difficulty breathing, cough)       Denies all except shortness of breath when he is at home after work.  He states that he is more aware of the pain when he is off of work  Protocols used: Chest Pain-A-AH

## 2023-11-01 NOTE — Telephone Encounter (Signed)
Appt 7/11

## 2023-11-03 ENCOUNTER — Ambulatory Visit: Admitting: Family

## 2023-11-04 DIAGNOSIS — Z419 Encounter for procedure for purposes other than remedying health state, unspecified: Secondary | ICD-10-CM | POA: Diagnosis not present

## 2023-12-04 DIAGNOSIS — J111 Influenza due to unidentified influenza virus with other respiratory manifestations: Secondary | ICD-10-CM | POA: Diagnosis not present

## 2023-12-04 DIAGNOSIS — U071 COVID-19: Secondary | ICD-10-CM | POA: Diagnosis not present

## 2023-12-05 DIAGNOSIS — Z419 Encounter for procedure for purposes other than remedying health state, unspecified: Secondary | ICD-10-CM | POA: Diagnosis not present

## 2023-12-13 DIAGNOSIS — Z87891 Personal history of nicotine dependence: Secondary | ICD-10-CM | POA: Diagnosis not present

## 2023-12-13 DIAGNOSIS — R079 Chest pain, unspecified: Secondary | ICD-10-CM | POA: Diagnosis not present

## 2023-12-13 DIAGNOSIS — J45909 Unspecified asthma, uncomplicated: Secondary | ICD-10-CM | POA: Diagnosis not present

## 2023-12-13 DIAGNOSIS — Z8616 Personal history of COVID-19: Secondary | ICD-10-CM | POA: Diagnosis not present

## 2023-12-13 DIAGNOSIS — J029 Acute pharyngitis, unspecified: Secondary | ICD-10-CM | POA: Diagnosis not present

## 2023-12-13 DIAGNOSIS — I498 Other specified cardiac arrhythmias: Secondary | ICD-10-CM | POA: Diagnosis not present

## 2023-12-19 DIAGNOSIS — R0789 Other chest pain: Secondary | ICD-10-CM | POA: Diagnosis not present

## 2023-12-19 DIAGNOSIS — E66811 Obesity, class 1: Secondary | ICD-10-CM | POA: Diagnosis not present

## 2023-12-19 DIAGNOSIS — J45909 Unspecified asthma, uncomplicated: Secondary | ICD-10-CM | POA: Diagnosis not present

## 2023-12-19 DIAGNOSIS — Z6834 Body mass index (BMI) 34.0-34.9, adult: Secondary | ICD-10-CM | POA: Diagnosis not present

## 2024-01-05 DIAGNOSIS — Z419 Encounter for procedure for purposes other than remedying health state, unspecified: Secondary | ICD-10-CM | POA: Diagnosis not present

## 2024-01-09 DIAGNOSIS — R194 Change in bowel habit: Secondary | ICD-10-CM | POA: Diagnosis not present

## 2024-01-09 DIAGNOSIS — R142 Eructation: Secondary | ICD-10-CM | POA: Diagnosis not present

## 2024-01-09 DIAGNOSIS — K219 Gastro-esophageal reflux disease without esophagitis: Secondary | ICD-10-CM | POA: Diagnosis not present

## 2024-01-12 DIAGNOSIS — R1013 Epigastric pain: Secondary | ICD-10-CM | POA: Diagnosis not present

## 2024-01-12 DIAGNOSIS — K219 Gastro-esophageal reflux disease without esophagitis: Secondary | ICD-10-CM | POA: Diagnosis not present

## 2024-01-12 DIAGNOSIS — K259 Gastric ulcer, unspecified as acute or chronic, without hemorrhage or perforation: Secondary | ICD-10-CM | POA: Diagnosis not present

## 2024-01-12 DIAGNOSIS — K3189 Other diseases of stomach and duodenum: Secondary | ICD-10-CM | POA: Diagnosis not present

## 2024-01-25 DIAGNOSIS — Z20822 Contact with and (suspected) exposure to covid-19: Secondary | ICD-10-CM | POA: Diagnosis not present

## 2024-01-25 DIAGNOSIS — J069 Acute upper respiratory infection, unspecified: Secondary | ICD-10-CM | POA: Diagnosis not present

## 2024-02-04 DIAGNOSIS — Z419 Encounter for procedure for purposes other than remedying health state, unspecified: Secondary | ICD-10-CM | POA: Diagnosis not present

## 2024-03-06 DIAGNOSIS — Z419 Encounter for procedure for purposes other than remedying health state, unspecified: Secondary | ICD-10-CM | POA: Diagnosis not present
